# Patient Record
Sex: Male | Born: 1935
Health system: Southern US, Community
[De-identification: ages and names within clinical notes are randomized; demographics above are authoritative.]

## PROBLEM LIST (undated history)

## (undated) DIAGNOSIS — I872 Venous insufficiency (chronic) (peripheral): Secondary | ICD-10-CM

## (undated) DIAGNOSIS — L719 Rosacea, unspecified: Secondary | ICD-10-CM

## (undated) DIAGNOSIS — I442 Atrioventricular block, complete: Secondary | ICD-10-CM

## (undated) DIAGNOSIS — Z8489 Family history of other specified conditions: Secondary | ICD-10-CM

## (undated) DIAGNOSIS — I480 Paroxysmal atrial fibrillation: Secondary | ICD-10-CM

## (undated) DIAGNOSIS — I83893 Varicose veins of bilateral lower extremities with other complications: Secondary | ICD-10-CM

## (undated) DIAGNOSIS — H3530 Unspecified macular degeneration: Secondary | ICD-10-CM

## (undated) DIAGNOSIS — D696 Thrombocytopenia, unspecified: Secondary | ICD-10-CM

## (undated) HISTORY — PX: TONSILLECTOMY: SUR1361

## (undated) HISTORY — PX: KNEE ARTHROSCOPY: SUR90

## (undated) HISTORY — DX: Thrombocytopenia, unspecified: D69.6

## (undated) HISTORY — DX: Rosacea, unspecified: L71.9

## (undated) HISTORY — DX: Paroxysmal atrial fibrillation: I48.0

## (undated) HISTORY — DX: Gilbert syndrome: E80.4

## (undated) HISTORY — DX: Atrioventricular block, complete: I44.2

## (undated) HISTORY — DX: Unspecified macular degeneration: H35.30

## (undated) HISTORY — PX: VASECTOMY: SHX75

## (undated) HISTORY — DX: Venous insufficiency (chronic) (peripheral): I87.2

## (undated) HISTORY — DX: Varicose veins of bilateral lower extremities with other complications: I83.893

---

## 2002-03-16 ENCOUNTER — Ambulatory Visit (HOSPITAL_COMMUNITY): Admission: RE | Admit: 2002-03-16 | Discharge: 2002-03-16 | Payer: Self-pay | Admitting: Gastroenterology

## 2009-07-18 ENCOUNTER — Ambulatory Visit: Payer: Self-pay | Admitting: Internal Medicine

## 2009-07-24 LAB — CBC WITH DIFFERENTIAL/PLATELET
BASO%: 0.4 % (ref 0.0–2.0)
EOS%: 2.7 % (ref 0.0–7.0)
MCH: 31 pg (ref 27.2–33.4)
MCHC: 33.3 g/dL (ref 32.0–36.0)
MONO#: 0.7 10*3/uL (ref 0.1–0.9)
RBC: 4.72 10*6/uL (ref 4.20–5.82)
RDW: 13 % (ref 11.0–14.6)
WBC: 7.9 10*3/uL (ref 4.0–10.3)
lymph#: 1.7 10*3/uL (ref 0.9–3.3)

## 2009-07-24 LAB — COMPREHENSIVE METABOLIC PANEL
ALT: 16 U/L (ref 0–53)
AST: 24 U/L (ref 0–37)
Albumin: 4.3 g/dL (ref 3.5–5.2)
Calcium: 9.4 mg/dL (ref 8.4–10.5)
Chloride: 104 mEq/L (ref 96–112)
Creatinine, Ser: 1.15 mg/dL (ref 0.40–1.50)
Potassium: 4.6 mEq/L (ref 3.5–5.3)
Sodium: 141 mEq/L (ref 135–145)
Total Protein: 6.6 g/dL (ref 6.0–8.3)

## 2010-06-06 NOTE — Op Note (Signed)
   NAME:  HOVANES, HYMAS NO.:  [id number]   MEDICAL RECORD NO.:  [id number]                   PATIENT TYPE:  AMB   LOCATION:  ENDO                                 FACILITY:  [facility]   PHYSICIAN:  Danise Edge, M.D.                DATE OF BIRTH:  [birth year]   DATE OF PROCEDURE:  03/16/2002  DATE OF DISCHARGE:                                 OPERATIVE REPORT   PROCEDURE:  Colonoscopy.   INDICATIONS:  The patient is a 77 year old male born 22-Apr-2031.  The patient  is scheduled to undergo his first screening colonoscopy with polypectomy to  prevent colon cancer.   ENDOSCOPIST:  Danise Edge, M.D.   PREMEDICATION:  Versed 5 mg, Demerol 50 mg.   ENDOSCOPE:  Olympus adult colonoscope.   DESCRIPTION OF PROCEDURE:  After obtaining informed consent, the patient was  placed in the left lateral decubitus position.  I administered intravenous  Demerol and intravenous Versed to achieve conscious sedation for the  procedure.  The patient's blood pressure, oxygen saturation, and cardiac  rhythm were monitored throughout the procedure and documented in the medical  record.   Anal inspection was normal.  Digital rectal exam was normal.  The prostate  was non-nodular.  The Olympus adult colonoscope was introduced into the  rectum and advanced to the cecum.  Colonic preparation for the exam today  was excellent.   Rectum normal.   Sigmoid colon and descending colon normal.   Splenic flexure normal.   Transverse colon normal.   Hepatic flexure normal.   Ascending colon normal.   Cecum and ileocecal valve normal.    ASSESSMENT:  Normal screening proctocolonoscopy to the cecum.  No endoscopic  evidence for the presence of colorectal neoplasia.                                               Danise Edge, M.D.    MJ/MEDQ  D:  03/16/2002  T:  03/16/2002  Job:  [id number]   cc:   Ike Bene, M.D.  [address], [address]  [place]  [place]  [address]  Fax: [phone]   Sheppard Plumber. Earlene Plater, M.D.  [address] [place]  [place] [address]  Fax: [phone]

## 2011-02-03 ENCOUNTER — Encounter: Payer: Self-pay | Admitting: Vascular Surgery

## 2011-02-05 ENCOUNTER — Encounter: Payer: Self-pay | Admitting: Vascular Surgery

## 2011-02-06 ENCOUNTER — Ambulatory Visit (INDEPENDENT_AMBULATORY_CARE_PROVIDER_SITE_OTHER): Payer: Medicare Other | Admitting: *Deleted

## 2011-02-06 ENCOUNTER — Ambulatory Visit (INDEPENDENT_AMBULATORY_CARE_PROVIDER_SITE_OTHER): Payer: Medicare Other | Admitting: Vascular Surgery

## 2011-02-06 ENCOUNTER — Encounter: Payer: Self-pay | Admitting: Vascular Surgery

## 2011-02-06 VITALS — BP 126/84 | HR 76 | Resp 12 | Ht 70.0 in | Wt 190.0 lb

## 2011-02-06 DIAGNOSIS — I83893 Varicose veins of bilateral lower extremities with other complications: Secondary | ICD-10-CM

## 2011-02-06 DIAGNOSIS — I872 Venous insufficiency (chronic) (peripheral): Secondary | ICD-10-CM

## 2011-02-06 DIAGNOSIS — M79609 Pain in unspecified limb: Secondary | ICD-10-CM

## 2011-02-06 HISTORY — DX: Venous insufficiency (chronic) (peripheral): I87.2

## 2011-02-06 HISTORY — DX: Varicose veins of bilateral lower extremities with other complications: I83.893

## 2011-02-06 NOTE — Progress Notes (Signed)
[facility] [place]  Referred by:  Hal Stormy Fabian, MD [address] [place], [place] [address]  Reason for referral: Painful left leg varicosities  History of Present Illness  Matthew [name] is a 78 y.o. male who presents with chief complaint: swollen leg.  Patient notes approximately 10 years history of varicose veins in both leg.  He has progression of his sx with increased swelling as the day continues, burning in L calf, and limited aching in both calves.  He has been using calf high compression stocking.  The patient has had no history of DVT, known history of varicose vein, no history of venous stasis ulcers, no history of  Lymphedema and no history of skin changes in lower legs.  There is a maternal history of varicose veins.  Pt is an avid runner so he would mgmt of his sx.  Past Medical History  Diagnosis Date  . Rosacea   . Macular degeneration   . Gilbert's syndrome   . Thrombocytopenia     Mild per Dr. Arbutus Ped  . Bradycardia   . Venous insufficiency     Past Surgical History  Procedure Date  . Knee arthroscopy     right knee  . Tonsillectomy   . Vasectomy     History   Social History  . Marital Status: Single    Spouse Name: N/A    Number of Children: N/A  . Years of Education: N/A   Occupational History  . Not on file.   Social History Main Topics  . Smoking status: Never Smoker   . Smokeless tobacco: Never Used  . Alcohol Use: Yes     rare  . Drug Use: Not on file  . Sexually Active: Not on file   Other Topics Concern  . Not on file   Social History Narrative  . No narrative on file    Family History  Problem Relation Age of Onset  . Cancer Brother     acute lymphocytic leukemia  . Hypertension Brother   . Heart disease Brother   . Diabetes Brother   . Cancer Mother     Current Outpatient Prescriptions on File Prior to Visit  Medication Sig Dispense Refill  . fluticasone (FLONASE) 50 MCG/ACT nasal  spray Place 2 sprays into the nose daily.      . Glucosamine 500 MG TABS Take by mouth every Monday, Wednesday, and Friday.      . Lutein 6 MG TABS Take by mouth.      . Omega-3 Fatty Acids (FISH OIL) 1000 MG CAPS Take 1,000 mg by mouth every Monday, Wednesday, and Friday.      . sildenafil (VIAGRA) 100 MG tablet Take 100 mg by mouth daily as needed.        Allergies  Allergen Reactions  . Codeine     "makes him crazy"     Review of Systems (Positive items checked otherwise negative)  General: [ ]  Weight loss, [ ]  Weight gain, [ ]   Loss of appetite, [ ]  Fever  Neurologic: [ ]  Dizziness, [ ]  Blackouts, [ ]  Headaches, [ ]  Seizure  Ear/Nose/Throat: [ ]  Change in eyesight, [ ]  Change in hearing, [ ]  Nose bleeds, [ ]  Sore throat  Vascular: [ ]  Pain in legs with walking, [ ]  Pain in feet while lying flat, [ ]  Non-healing ulcer, Stroke, [ ]  "Mini stroke", [ ]  Slurred speech, [ ]  Temporary blindness, [ ]  Blood clot  in vein, [ ]  Phlebitis, [x]  varicose veins  Pulmonary: [ ]  Home oxygen, [ ]  Productive cough, [ ]  Bronchitis, [ ]  Coughing up blood, [ ]  Asthma, [ ]  Wheezing  Musculoskeletal: [ ]  Arthritis, [ ]  Joint pain, [ ]  Muscle pain  Cardiac: [ ]  Chest pain, [ ]  Chest tightness/pressure, [ ]  Shortness of breath when lying flat, [ ]  Shortness of breath with exertion, [ ]  Palpitations, [ ]  Heart murmur, [ ]  Arrythmia,  [ ]  Atrial fibrillation  Hematologic: [ ]  Bleeding problems, [ ]  Clotting disorder, [ ]  Anemia  Psychiatric:  [ ]  Depression, [ ]  Anxiety, [ ]  Attention deficit disorder  Gastrointestinal:  [ ]  Black stool,[ ]   Blood in stool, [ ]  Peptic ulcer disease, [ ]  Reflux, [ ]  Hiatal hernia, [ ]  Trouble swallowing, [ ]  Diarrhea, [ ]  Constipation  Urinary:  [ ]  Kidney disease, [ ]  Burning with urination, [ ]  Frequent urination, [ ]  Difficulty urinating  Skin: [ ]  Ulcers, [ ]  Rashes   Physical Examination  Filed Vitals:   02/06/11 1025  BP: 126/84  Pulse: 76  Resp: 12    Height: 5\' 10"  (1.778 m)  Weight: 190 lb (86.183 kg)   Body mass index is 27.26 kg/(m^2).  General: A&O x 3, WDWN  Head: [place]/AT  Ear/Nose/Throat: Hearing grossly intact, nares w/o erythema or drainage, oropharynx w/o Erythema/Exudate  Eyes: PERRLA, EOMI  Neck: Supple, no nuchal rigidity, no palpable LAD  Pulmonary: Sym exp, good air movt, CTAB, no rales, rhonchi, & wheezing  Cardiac: RRR, Nl S1, S2, no Murmurs, rubs or gallops  Vascular: Vessel Right Left  Radial Palpable Palpable  Brachial Palpable Palpable  Carotid Palpable, without bruit Palpable, without bruit  Aorta Non-palpable N/A  Femoral Palpable Palpable  Popliteal Non-palpable Non-palpable  PT Palpable Palpable  DP Palpable Palpable   Gastrointestinal: soft, NTND, -G/R, - HSM, - masses, - CVAT B  Musculoskeletal: M/S 5/5 throughout , Extremities without ischemic changes , extensive varicose veins and spider veins in both leg, lipodermatosclerosis in both legs, minimal swelling in both legs  Neurologic: CN 2-12 intact , Pain and light touch intact in extremities , Motor exam as listed above  Psychiatric: Judgment intact, Mood & affect appropriate for pt's clinical situation  Dermatologic: See M/S exam for extremity exam, no rashes otherwise noted  Lymph : No Cervical, Axillary, or Inguinal lymphadenopathy   Non-Invasive Vascular Imaging  BLE Venous Insufficiency Duplex (Date: 02/06/11):   RLE: not examined  LLE: L GSV reflux, normal deep system, no DVT  Outside Studies/Documentation 5 pages of outside documents were reviewed including: outpatient clinic workup of his CVI.  Medical Decision Making  TIMOTEO CARREIRO is a 78 y.o. male who presents with: chronic venous insufficiency (C4).   Based on the patient's history and examination, I recommend: thigh high compression stocking 20-30 mm Hg for 3 months to meet insurance requirement prior to left GSV laser ablation.  Follow up in 3 months for evaluation  in vein clinic for EVLA of L GSV.  I suspect the R leg will also have significant reflux, so that study will need to be done prior to intervention on that leg.  Thank you for allowing [place] to participate in this patient's care.  Leonides Sake, MD [facility] Office: [phone] Pager: [phone]  02/06/2011, 10:35 AM

## 2011-02-12 NOTE — Procedures (Unsigned)
LOWER EXTREMITY VENOUS REFLUX EXAM  INDICATION:  Complaint of left lower extremity pain and discoloration.  EXAM:  Using color-flow imaging and pulse Doppler spectral analysis, the left common femoral, superficial femoral, popliteal, posterior tibial, greater and lesser saphenous veins are evaluated.  There is no evidence suggesting deep venous insufficiency in the left lower extremity.  The left saphenofemoral junction and nontortuous great saphenous vein demonstrate reflux of >58milliseconds with calibers as described below.   The left proximal short saphenous vein demonstrates competency.  GSV Diameter (used if found to be incompetent only)                                           Right    Left Proximal Greater Saphenous Vein           cm       0.64 cm Proximal-to-mid-thigh                     cm       cm Mid thigh                                 cm       0.66 cm Mid-distal thigh                          cm       cm Distal thigh                              cm       0.54 cm Knee                                      cm       0.55 cm  IMPRESSION: 1. Left great saphenous vein reflux is noted, as described above. 2. The left deep venous system and small saphenous vein demonstrate     competency.   ___________________________________________ Fransisco Hertz, MD  CH/MEDQ  D:  02/09/2011  T:  02/09/2011  Job:  [id number]

## 2011-05-04 ENCOUNTER — Ambulatory Visit: Payer: Medicare Other | Admitting: Vascular Surgery

## 2011-05-08 ENCOUNTER — Encounter: Payer: Self-pay | Admitting: Vascular Surgery

## 2011-05-11 ENCOUNTER — Encounter: Payer: Self-pay | Admitting: Vascular Surgery

## 2011-05-11 ENCOUNTER — Ambulatory Visit (INDEPENDENT_AMBULATORY_CARE_PROVIDER_SITE_OTHER): Payer: Medicare Other | Admitting: Vascular Surgery

## 2011-05-11 VITALS — BP 129/82 | HR 60 | Resp 20 | Ht 71.0 in | Wt 182.0 lb

## 2011-05-11 DIAGNOSIS — J17 Pneumonia in diseases classified elsewhere: Secondary | ICD-10-CM | POA: Insufficient documentation

## 2011-05-11 DIAGNOSIS — B999 Unspecified infectious disease: Secondary | ICD-10-CM | POA: Insufficient documentation

## 2011-05-11 DIAGNOSIS — I83893 Varicose veins of bilateral lower extremities with other complications: Secondary | ICD-10-CM

## 2011-05-11 NOTE — Progress Notes (Signed)
Subjective:     Patient ID: Matthew [name], male   DOB: 17-Jun-1931, 78 y.o.   MRN: [id number]  HPI this 78 year old male returns for further evaluation regarding venous insufficiency both legs left worse than right. He has painful varicosities in the left leg which have been causing aching burning and stinging discomfort. This has persisted despite long light elastic compression stockings 20-30 mm gradient and elevation and ibuprofen for the past 3 months. He has similar symptoms in the right leg which are not quite as severe. These are also resistant to conservative measures. He has no history of DVT.  Past Medical History  Diagnosis Date  . Rosacea   . Macular degeneration   . Gilbert's syndrome   . Thrombocytopenia     Mild per Dr. Arbutus Ped  . Bradycardia   . Venous insufficiency     History  Substance Use Topics  . Smoking status: Never Smoker   . Smokeless tobacco: Never Used  . Alcohol Use: Yes     rare    Family History  Problem Relation Age of Onset  . Cancer Brother     acute lymphocytic leukemia  . Hypertension Brother   . Heart disease Brother   . Diabetes Brother   . Cancer Mother     Allergies  Allergen Reactions  . Codeine     "makes him crazy"    Current outpatient prescriptions:dabigatran (PRADAXA) 150 MG CAPS, Take 150 mg by mouth every 12 (twelve) hours., Disp: , Rfl: ;  fluticasone (FLONASE) 50 MCG/ACT nasal spray, Place 2 sprays into the nose daily., Disp: , Rfl: ;  Glucosamine 500 MG TABS, Take by mouth every Monday, Wednesday, and Friday., Disp: , Rfl: ;  Lutein 6 MG TABS, Take by mouth., Disp: , Rfl:  Omega-3 Fatty Acids (FISH OIL) 1000 MG CAPS, Take 1,000 mg by mouth every Monday, Wednesday, and Friday., Disp: , Rfl: ;  sildenafil (VIAGRA) 100 MG tablet, Take 100 mg by mouth daily as needed., Disp: , Rfl:   BP 129/82  Pulse 60  Resp 20  Ht 5\' 11"  (1.803 m)  Wt 182 lb (82.555 kg)  BMI 25.38 kg/m2  Body mass index is 25.38  kg/(m^2).         Review of Systems     Objective:   Physical Exam blood pressure 102 heart rate 60 respirations 20 General well-developed well-nourished male in no apparent distress alert and oriented x3 Lower extremity exam left leg 3+ femoral popliteal dorsalis pedis pulse is palpable. There is hyperpigmentation in the lower third of the left leg. He has extensive patterns of reticular and spider veins in the medial thigh and medial calf with some bulging varicosities over the great saphenous system. Right leg has similar reticular and spider findings but no bulging varicosities. He does have hyperpigmentation on the right as well.    Assessment:     Venous duplex exam last visit confirmed gross reflux left greater saphenous vine Right leg has not been studied formally This patient has symptomatic venous insufficiency left leg which has not responded to conservative measures and is affecting his daily living    Plan:     The patient needs a laser ablation left great saphenous vein to be followed by a return appointment in 3 months for further examination. We'll obtain formal venous duplex exam of right lower extremity following procedure on left leg

## 2011-05-18 ENCOUNTER — Other Ambulatory Visit: Payer: Self-pay | Admitting: *Deleted

## 2011-05-18 DIAGNOSIS — I83893 Varicose veins of bilateral lower extremities with other complications: Secondary | ICD-10-CM

## 2011-05-26 ENCOUNTER — Other Ambulatory Visit: Payer: Self-pay | Admitting: *Deleted

## 2011-05-26 DIAGNOSIS — I83893 Varicose veins of bilateral lower extremities with other complications: Secondary | ICD-10-CM

## 2011-06-19 ENCOUNTER — Encounter: Payer: Self-pay | Admitting: Vascular Surgery

## 2011-06-22 ENCOUNTER — Encounter: Payer: Self-pay | Admitting: Vascular Surgery

## 2011-06-22 ENCOUNTER — Ambulatory Visit (INDEPENDENT_AMBULATORY_CARE_PROVIDER_SITE_OTHER): Payer: Medicare Other | Admitting: Vascular Surgery

## 2011-06-22 VITALS — BP 158/68 | HR 53 | Resp 18 | Ht 71.0 in | Wt 182.0 lb

## 2011-06-22 DIAGNOSIS — I83893 Varicose veins of bilateral lower extremities with other complications: Secondary | ICD-10-CM

## 2011-06-22 HISTORY — PX: ENDOVENOUS ABLATION SAPHENOUS VEIN W/ LASER: SUR449

## 2011-06-22 NOTE — Progress Notes (Signed)
Laser Ablation Procedure      Date: 06/22/2011    Matthew [name] DOB:11/22/1931  Consent signed: Yes  Surgeon:J.D. Hart Rochester  Procedure: Laser Ablation: left Greater Saphenous Vein  BP 158/68  Pulse 53  Resp 18  Ht 5\' 11"  (1.803 m)  Wt 182 lb (82.555 kg)  BMI 25.38 kg/m2  Start time: 1:00   End time: 1:50  Tumescent Anesthesia: 250 cc 0.9% NaCl with 50 cc Lidocaine HCL with 1% Epi and 15 cc 8.4% NaHCO3  Local Anesthesia: 6 cc Lidocaine HCL and NaHCO3 (ratio 2:1)  Pulsed mode: Watts 15 Seconds 1 Pulses:1 Total Pulses:139 Total Energy: 2085 Total Time: 2:19    Patient tolerated procedure well: Yes  Notes:   Description of Procedure:  After marking the course of the saphenous vein and the secondary varicosities in the standing position, the patient was placed on the operating table in the supine position, and the left leg was prepped and draped in sterile fashion. Local anesthetic was administered, and under ultrasound guidance the saphenous vein was accessed with a micro needle and guide wire; then the micro puncture sheath was placed. A guide wire was inserted to the saphenofemoral junction, followed by a 5 french sheath.  The position of the sheath and then the laser fiber below the junction was confirmed using the ultrasound and visualization of the aiming beam.  Tumescent anesthesia was administered along the course of the saphenous vein using ultrasound guidance. Protective laser glasses were placed on the patient, and the laser was fired at 15 watt pulsed mode advancing 1-2 mm per sec.  For a total of 2085 joules.  A steri strip was applied to the puncture site.    ABD pads and thigh high compression stockings were applied.  Ace wrap bandages were applied over the phlebectomy sites and at the top of the saphenofemoral junction.  Blood loss was less than 15 cc.  The patient ambulated out of the operating room having tolerated the procedure well.

## 2011-06-22 NOTE — Progress Notes (Signed)
Subjective:     Patient ID: Matthew [name], male   DOB: 1931-12-23, 78 y.o.   MRN: [id number]  HPI this 78 year old male patient had laser ablation of the left great saphenous vein from the knee to the saphenofemoral junction performed under local tumescent anesthesia today. A total of 2085 J of energy was utilized. He tolerated the procedure well.   Review of Systems     Objective:   Physical ExamBP 158/68  Pulse 53  Resp 18  Ht 5\' 11"  (1.803 m)  Wt 182 lb (82.555 kg)  BMI 25.38 kg/m2      Assessment:    well-tolerated laser ablation left great saphenous vein performed for venous hypertension with severe skin changes distally    Plan:     Return June 10 for venous duplex exam to confirm closure left great saphenous vein and reflux study of right great saphenous system

## 2011-06-23 ENCOUNTER — Telehealth: Payer: Self-pay | Admitting: *Deleted

## 2011-06-23 ENCOUNTER — Encounter: Payer: Self-pay | Admitting: Vascular Surgery

## 2011-06-23 NOTE — Telephone Encounter (Signed)
Pt doing well. No problems or pain. Following all instructions. Reminded him of his fu appt next mon.

## 2011-06-26 ENCOUNTER — Encounter: Payer: Self-pay | Admitting: Vascular Surgery

## 2011-06-29 ENCOUNTER — Ambulatory Visit (INDEPENDENT_AMBULATORY_CARE_PROVIDER_SITE_OTHER): Payer: Medicare Other | Admitting: Vascular Surgery

## 2011-06-29 ENCOUNTER — Encounter: Payer: Self-pay | Admitting: Vascular Surgery

## 2011-06-29 ENCOUNTER — Encounter (INDEPENDENT_AMBULATORY_CARE_PROVIDER_SITE_OTHER): Payer: Medicare Other | Admitting: *Deleted

## 2011-06-29 VITALS — BP 125/77 | HR 56 | Temp 98.2°F | Ht 71.0 in | Wt 185.0 lb

## 2011-06-29 DIAGNOSIS — I83893 Varicose veins of bilateral lower extremities with other complications: Secondary | ICD-10-CM

## 2011-06-29 DIAGNOSIS — I8390 Asymptomatic varicose veins of unspecified lower extremity: Secondary | ICD-10-CM | POA: Insufficient documentation

## 2011-06-29 DIAGNOSIS — Z48812 Encounter for surgical aftercare following surgery on the circulatory system: Secondary | ICD-10-CM

## 2011-06-29 NOTE — Progress Notes (Signed)
Subjective:     Patient ID: Matthew [name], male   DOB: 15-Sep-1931, 78 y.o.   MRN: [id number]  HPI this 78 year old male returns 1 week post laser ablation left great saphenous vein for venous hypertension with pain consisting of aching throbbing and burning discomfort. He has had no chest pain, swelling in the left ankle, or other new symptoms since his procedure week ago. He has been wearing his long leg elastic stocking as well as taking ibuprofen he has been having also some similar symptoms but not as severe in the right lower extremity.  Past Medical History  Diagnosis Date  . Rosacea   . Macular degeneration   . Gilbert's syndrome   . Thrombocytopenia     Mild per Dr. Arbutus Ped  . Bradycardia   . Venous insufficiency     History  Substance Use Topics  . Smoking status: Never Smoker   . Smokeless tobacco: Never Used  . Alcohol Use: Yes     rare    Family History  Problem Relation Age of Onset  . Cancer Brother     acute lymphocytic leukemia  . Hypertension Brother   . Heart disease Brother   . Diabetes Brother   . Cancer Mother     Allergies  Allergen Reactions  . Codeine     "makes him crazy"    Current outpatient prescriptions:dabigatran (PRADAXA) 150 MG CAPS, Take 150 mg by mouth every 12 (twelve) hours., Disp: , Rfl: ;  fluticasone (FLONASE) 50 MCG/ACT nasal spray, Place 2 sprays into the nose daily., Disp: , Rfl: ;  Glucosamine 500 MG TABS, Take by mouth every Monday, Wednesday, and Friday., Disp: , Rfl: ;  Lutein 6 MG TABS, Take by mouth., Disp: , Rfl:  Omega-3 Fatty Acids (FISH OIL) 1000 MG CAPS, Take 1,000 mg by mouth every Monday, Wednesday, and Friday., Disp: , Rfl: ;  sildenafil (VIAGRA) 100 MG tablet, Take 100 mg by mouth daily as needed., Disp: , Rfl:   BP 125/77  Pulse 56  Temp(Src) 98.2 F (36.8 C) (Oral)  Ht 5\' 11"  (1.803 m)  Wt 185 lb (83.915 kg)  BMI 25.80 kg/m2  Body mass index is 25.80 kg/(m^2).           Review of Systems denies  chest pain, dyspnea on exertion, PND, orthopnea, hemoptysis, claudication.     Objective:   Physical Exam blood pressure 125/77 heart rate 56 respirations 18 General well-developed well-nourished male no apparent stress alert and oriented x3 Lungs no rhonchi or wheezing Left leg with no distal edema. There is mild/moderate tenderness along the course of the great saphenous vein from the knee to the saphenofemoral junction but no ecchymosis. There is 3+ dorsalis pedis pulse palpable. Right leg has scattered spider veins but no bulging varicosities or distal edema.  Today I ordered a venous duplex exam of the right and left legs. There is no DVT in the left leg and the left great saphenous vein is closed from the knee to near the saphenofemoral junction. Right leg has no significant reflux in the superficial system and no DVT    Assessment:     Successful laser ablation left great saphenous vein for pain and swelling due to venous hypertension No significant venous insufficiency right leg on duplex exam    Plan:     Will wear elastic compression stockings for one more week and then on an as necessary basis Return to see [place] on when necessary basis

## 2011-06-30 ENCOUNTER — Ambulatory Visit: Payer: Medicare Other | Admitting: Vascular Surgery

## 2011-07-06 NOTE — Procedures (Unsigned)
DUPLEX DEEP VENOUS EXAM - LOWER EXTREMITY  INDICATION:  One week status post left great saphenous vein ablation; right lower extremity swelling with varicose veins  HISTORY:  Edema:  Yes Trauma/Surgery:  Ablation of left great saphenous vein Pain:  No PE:  No Previous DVT:  No Anticoagulants:  No Other:  DUPLEX EXAM:               CFV   SFV   PopV  PTV    GSV               R  L  R  L  R  L  R   L  R  L Thrombosis    o  o  o     o  o  o      o  + Spontaneous   +  +  +     +  +  +      +  o Phasic        +  +  +     +  +  +      +  o Augmentation  +  +  +     +  +  +      +  o Compressible  +  +  +     +  +  +      +  o Competent     +     +     +     +      +  Legend:  + - yes  o - no  p - partial  D - decreased  IMPRESSION: 1. Successful ablation of left great saphenous vein approximately 2.7     cm distal to the junction through the insertion point without     common femoral vein involvement. 2. No evidence of deep or superficial venous reflux in the right lower     extremity.  No evidence of deep venous thrombosis or superficial     venous thrombus in the right lower extremity. 3. Of note, venous flow is markedly pulsatile.   _____________________________ Quita Skye. Hart [place], M.D.  LT/MEDQ  D:  06/29/2011  T:  06/29/2011  Job:  [id number]

## 2011-08-05 ENCOUNTER — Encounter (HOSPITAL_COMMUNITY): Payer: Self-pay | Admitting: Pharmacy Technician

## 2011-08-06 ENCOUNTER — Other Ambulatory Visit: Payer: Self-pay | Admitting: Cardiology

## 2011-08-06 NOTE — H&P (Signed)
Office Visit     Patient: Matthew [name], Matthew [name] Provider: Crixus Mcaulay, MD  DOB: 01/18/1932 Age: 79 Y Sex: Male Date: 08/05/2011  Phone: [phone]   Address: [address], [place], [place]  Pcp: HAL STONEKING       Subjective:     CC:    1. 4 month follow up.        HPI:  General:  To quote prior note one month ago "79-year-old here for evaluation of atrial fibrillation. Echocardiogram showed normal EF, mild MR/TR and mild RV/LA dilatation. Holter monitor showed atrial fibrillation with slow ventricular response. TSH was 5.7 mildly elevated. His first EKG apparent the looked like junctional rhythm. He was asymptomatic. He may need a pacemaker in the future due to AV node disease. He is on no medication. Heart rate is 50.   According to the new CHAD-VAS score, he is 2 for age >75. Because of this we have long discussion about anticoagulation. I went ahead and prescribed him Pradaxa 150mg BID. I instructed clearly about this medication. He is to take this with meal. Look for any signs of GERD. With his mild thrombocytopenia, look for signs of bleeding. I also described to him that age greater than 75 portends a slightly higher risk of bleeding as well. We discussed that there is no reversal agent for this medication.   He has had no prior stroke, no diabetes. He's not treated for hypertension. 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 He is a pilot."  03/31/10 - Has GERD/ Taking it during meals and this has improved. No bleeding. No CP. Has a bad cold recently. Some spotting of nasal passage.He was asymptomatic in regards to his atrial fibrillation. He did have a bad "flu" approximately 10 days after taking the flu shot. I wonder if this was true during his atrial fibrillation activity. Nonetheless, he did have 24 hours of atrial fibrillation on his heart monitor. Today on auscultation, he sounds regular. I will verify with EKG. We had a lengthy discussion with he and his wife, questions were  answered. If he is in normal rhythm, I explained to him that he does not need a cardioversion. He believes that the atrial fibrillation may be because he was out of his routine, jogging, because of his arthroscopic knee surgery. We have decided not to pursue cardioversion. He is asymptomatic. His heart rate is well controlled. If he were having symptoms or limitations I would certainly be pushing for this. Continue with Pradaxa. Platelet count 102,000. He has seen hematologist in the past. Stable. Avoid aspirin on a continuous basis. On August 05, 2011 he is doing very well. No bleeding. No strokelike symptoms. He does not feel any symptoms from his atrial fibrillation. No chest pain..        ROS:  No chest pain, no syncope, no bleeding, no stroke like symptoms.       Medical History: Rosacea, Macular degeneration, Gilbert's syndrome, hematuria neg eval Dr Grapey, mild thrombocytopenia-Dr. Mohamed, colonoscopy 2004 Dr Johnson, atrial fibrillation 01/2011 on Holter monitor, Tsh 5.7 01/2011 repeat 2 mos, mild MR echo 01/2011, venous insufficiency-Dr. Lawson.        Family History:        Social History:  General:  History of smoking  cigarettes: Never smoked Alcohol: yes, Rare.  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  Marital Status: married, Barbara.  Enjoys aviation built his own   airplane, enjoys running.       Medications: Fish Oil 1000 MG Capsule 1 tablet MWF, Lutein 6 MG Capsule 1 tablet MWF, Glucosamine 500 MG Tablet 1 tablet MWF, Pradaxa 150 MG . capsule 1 capsule Twice a day, Viagra 100MG Not Specified tablet TAKE ONE TABLET BY MOUTH ONCE A DAY AS NEEDED , Medication List reviewed and reconciled with the patient       Allergies: Codeine (for allergy): "crazy".       Objective:     Vitals: Wt 186, Wt change -3 lb, Ht 70, BMI 26.69, Pulse sitting 56, BP sitting 112/76.        Examination:  Cardiology, General:  GENERAL APPEARANCE: pleasant, NAD.  HEART SOUNDS: regular, normal S1, S2, no S3 or S4.  MURMUR: absent.  LUNGS: no rales or wheezes.  EXTREMITIES: no leg edema.        Assessment:     Assessment:  1. Atrial fibrillation - 427.31 (Primary)  2. Anticoagulant monitoring - V58.61  3. Thrombocytopenia - 287.5, Checking CBC today., Platelet count 102, stable.    Plan:     1. Atrial fibrillation  LAB: Basic Metabolic creatinine 1.1    GLUCOSE 78 70-99 - mg/dL    BUN 25 6-26 - mg/dL    CREATININE 1.16 0.60-1.30 - mg/dl    eGFR (NON-AFRICAN AMERICAN) 61 >60 - calc    eGFR (AFRICAN AMERICAN) 74 >60 - calc    SODIUM 139 136-145 - mmol/L    POTASSIUM 4.7 3.5-5.5 - mmol/L    CHLORIDE 104 98-107 - mmol/L    C02 31 22-32 - mg/dL    ANION GAP 8.9 6.0-20.0 - mmol/L    CALCIUM 9.5 8.6-10.3 - mg/dL     Layan Zalenski 08/05/2011 01:37:06 PM > reassuring     LAB: CBC with Diff hemoglobin 14, platelets 112     WBC 6.4 4.0-11.0 - K/ul     RBC 4.52 4.20-5.80 - M/uL     HGB 14.0 13.0-17.0 - g/dL     HCT 42.6 39.0-52.0 - %     MCH 31.0 27.0-33.0 - pg     MPV 9.8 7.5-10.7 - fL     MCV 94.3 80.0-94.0 - fL H    MCHC 32.8 32.0-36.0 - g/dL     RDW 13.4 11.5-15.5 - %     NRBC# 0.00 -     PLT 112 150-400 - K/uL L    NEUT % 61.0 43.3-71.9 - %     NRBC% 0.10 - %     LYMPH% 24.1 16.8-43.5 - %     MONO % 11.1 4.6-12.4 - %     EOS % 2.8 0.0-7.8 - %     BASO % 1.0 0.0-1.0 - %     NEUT # 3.9 1.9-7.2 - K/uL     LYMPH# 1.50 1.10-2.70 - K/uL     MONO # 0.7 0.3-0.8 - K/uL     EOS # 0.2 0.0-0.6 - K/uL     BASO # 0.1 0.0-0.1 - K/uL      Jimia Gentles 08/05/2011 01:36:44 PM > reassuring, on Pradaxa     LAB: PT (Prothrombin Time) (005199) 1.2, normal     Prothrombin Time 12.7 9.1-12.0 - SEC H    INR 1.2 0.8-1.2 -      Gust Eugene 08/05/2011 04:56:46 PM > okay for cardioversion, on Pradaxa     Diagnostic Imaging:EKG slow atrial flutter., Plummer,Wanda 08/05/2011  10:43:29 AM > Offie Waide 08/05/2011 10:52:51 AM > no significant change from   prior EKG., EKG Rhythm strip A flutter, Plummer,Wanda 08/05/2011 10:49:02 AM > Vernette Moise 08/05/2011 11:44:38 AM >  Doing very well but continues to demonstrate atrial flutter. I would like to give him an opportunity at sinus rhythm to improve his overall AV efficiency. We will set him up for cardioversion. He is currently on anticoagulation. He has had bradycardia in the past, heart rates in the 40s. He may very well revert to sinus bradycardia following cardioversion. We discussed the risks/benefits of cardioversion. Questions have been answered.        2. Anticoagulant monitoring  Stroke prevention with Pradaxa. Continue.       3. Thrombocytopenia  Continue to monitor for any signs of bleeding. He has seen hematology in the past. Stable.prior lab work reviewed.        Immunizations:        Labs:        Procedure Codes: 93000 EKG I AND R, 93040 RHYTHM STRIP I AND R, 80048 ECL BMP, 85025 ECL CBC PLATELET DIFF, 36415 BLOOD COLLECTION ROUTINE VENIPUNCTURE       Preventive:         Follow Up: post conversion      Provider: Emonee Winkowski, MD  Patient: Matthew [name], Matthew [name] DOB: 12/16/1931 Date: 08/05/2011    

## 2011-08-07 ENCOUNTER — Ambulatory Visit (HOSPITAL_COMMUNITY)
Admission: RE | Admit: 2011-08-07 | Discharge: 2011-08-07 | Disposition: A | Discharge status: Home / Self Care | Payer: Medicare Other | Source: Ambulatory Visit | Attending: Cardiology | Admitting: Cardiology

## 2011-08-07 ENCOUNTER — Encounter (HOSPITAL_COMMUNITY): Payer: Self-pay | Admitting: Anesthesiology

## 2011-08-07 ENCOUNTER — Encounter (HOSPITAL_COMMUNITY)
Admission: RE | Disposition: A | Discharge status: Home / Self Care | Payer: Self-pay | Source: Ambulatory Visit | Attending: Cardiology

## 2011-08-07 ENCOUNTER — Ambulatory Visit (HOSPITAL_COMMUNITY): Payer: Medicare Other | Admitting: Anesthesiology

## 2011-08-07 DIAGNOSIS — H3530 Unspecified macular degeneration: Secondary | ICD-10-CM | POA: Insufficient documentation

## 2011-08-07 DIAGNOSIS — K219 Gastro-esophageal reflux disease without esophagitis: Secondary | ICD-10-CM | POA: Insufficient documentation

## 2011-08-07 DIAGNOSIS — I4892 Unspecified atrial flutter: Secondary | ICD-10-CM | POA: Insufficient documentation

## 2011-08-07 DIAGNOSIS — I83893 Varicose veins of bilateral lower extremities with other complications: Secondary | ICD-10-CM

## 2011-08-07 HISTORY — PX: CARDIOVERSION: SHX1299

## 2011-08-07 SURGERY — CARDIOVERSION
Anesthesia: General | Wound class: Clean

## 2011-08-07 MED ORDER — SODIUM CHLORIDE 0.9 % IJ SOLN: 3.0000 mL | INTRAMUSCULAR | Status: DC | PRN

## 2011-08-07 MED ORDER — SODIUM CHLORIDE 0.9 % IV SOLN: 250.0000 mL | INTRAVENOUS | Status: DC

## 2011-08-07 MED ORDER — HYDROCORTISONE 1 % EX CREA
1.0000 "application " | TOPICAL_CREAM | Freq: Three times a day (TID) | CUTANEOUS | Status: DC | PRN
  Filled 2011-08-07: qty 28

## 2011-08-07 MED ORDER — LIDOCAINE HCL (CARDIAC) 20 MG/ML IV SOLN
INTRAVENOUS | Status: DC | PRN
  Administered 2011-08-07: 60 mg via INTRAVENOUS

## 2011-08-07 MED ORDER — GLYCOPYRROLATE 0.2 MG/ML IJ SOLN
INTRAMUSCULAR | Status: DC | PRN
  Administered 2011-08-07: 0.2 mg via INTRAVENOUS

## 2011-08-07 MED ORDER — ATROPINE SULFATE 1 MG/ML IJ SOLN
INTRAMUSCULAR | Status: AC
  Filled 2011-08-07: qty 1

## 2011-08-07 MED ORDER — SODIUM CHLORIDE 0.9 % IJ SOLN: 3.0000 mL | Freq: Two times a day (BID) | INTRAMUSCULAR | Status: DC

## 2011-08-07 MED ORDER — SODIUM CHLORIDE 0.9 % IV SOLN
INTRAVENOUS | Status: DC | PRN
  Administered 2011-08-07: 11:00:00 via INTRAVENOUS

## 2011-08-07 MED ORDER — PROPOFOL 10 MG/ML IV BOLUS
INTRAVENOUS | Status: DC | PRN
  Administered 2011-08-07: 70 mg via INTRAVENOUS

## 2011-08-07 NOTE — Anesthesia Preprocedure Evaluation (Addendum)
Anesthesia Evaluation  Patient identified by MRN, date of birth, ID band Patient awake    Reviewed: Allergy & Precautions, H&P , NPO status , Patient's Chart, lab work & pertinent test results  Airway Mallampati: II      Dental  (+) Teeth Intact   Pulmonary  breath sounds clear to auscultation        Cardiovascular     Neuro/Psych    GI/Hepatic   Endo/Other    Renal/GU      Musculoskeletal   Abdominal   Peds  Hematology   Anesthesia Other Findings   Reproductive/Obstetrics                          Anesthesia Physical Anesthesia Plan  ASA: II  Anesthesia Plan: General   Post-op Pain Management:    Induction: Intravenous  Airway Management Planned: Mask  Additional Equipment:   Intra-op Plan:   Post-operative Plan:   Informed Consent: I have reviewed the patients History and Physical, chart, labs and discussed the procedure including the risks, benefits and alternatives for the proposed anesthesia with the patient or authorized representative who has indicated his/her understanding and acceptance.   Dental advisory given  Plan Discussed with: CRNA and Anesthesiologist  Anesthesia Plan Comments:         Anesthesia Quick Evaluation

## 2011-08-07 NOTE — Anesthesia Postprocedure Evaluation (Signed)
  Anesthesia Post-op Note  Patient: Matthew [name]  Procedure(s) Performed: Procedure(s) (LRB): CARDIOVERSION (N/A)  Patient Location: Short Stay  Anesthesia Type: General  Level of Consciousness: awake, alert  and oriented  Airway and Oxygen Therapy: Patient Spontanous Breathing and Patient connected to nasal cannula oxygen  Post-op Pain: none  Post-op Assessment: Post-op Vital signs reviewed, Patient's Cardiovascular Status Stable, Respiratory Function Stable, RESPIRATORY FUNCTION UNSTABLE, Patent Airway and No signs of Nausea or vomiting  Post-op Vital Signs: Reviewed and stable  Complications: No apparent anesthesia complications

## 2011-08-07 NOTE — Interval H&P Note (Signed)
History and Physical Interval Note:  08/07/2011 11:08 AM  Matthew [name]  has presented today for surgery, with the diagnosis of a fib  The various methods of treatment have been discussed with the patient and family. After consideration of risks, benefits and other options for treatment, the patient has consented to  Procedure(s) (LRB): CARDIOVERSION (N/A) as a surgical intervention .  The patient's history has been reviewed, patient examined, no change in status, stable for surgery.  I have reviewed the patient's chart and labs.  Questions were answered to the patient's satisfaction.     Zamara Cozad

## 2011-08-07 NOTE — Addendum Note (Signed)
Addendum  created 08/07/11 1209 by Quentin Ore, CRNA   Modules edited:Anesthesia Responsible Staff

## 2011-08-07 NOTE — CV Procedure (Signed)
Electrical Cardioversion Procedure Note BORIS ENGELMANN [id number] October 29, 1931  Procedure: Electrical Cardioversion Indications:  Atrial Flutter  Time Out: Verified patient identification, verified procedure,medications/allergies/relevent history reviewed, required imaging and test results available.   Procedure Details  The patient was NPO after midnight. Anesthesia was administered at the beside  by Dr. Vernie Murders with 70mg  of propofol.  Cardioversion was performed with synchronized biphasic defibrillation via AP pads with 150 joules.  1 attempt(s) were performed.  The patient converted to normal sinus rhythm. The patient tolerated the procedure well   IMPRESSION:  Successful cardioversion of atrial flutter. Bradycardia post. Junctional escape as low as 30 bpm. 1mg  of Atropine given as well as Robinol prior. As he awoke, heart rate increased to 40-43 junctional. Occasional P wave noted.   Base at home has been in the 40's in the past.  Will closely monitor. If stable, discharge.     SKAINS, MARK 08/07/2011, 11:17 AM

## 2011-08-07 NOTE — H&P (View-Only) (Signed)
Office Visit     Patient: Matthew [name], Matthew [name] Provider: Donato Schultz, MD  DOB: 11/12/1931 Age: 78 Y Sex: Male Date: 08/05/2011  Phone: [phone]   Address: [address], [place], [id number]  Pcp: HAL STONEKING       Subjective:     CC:    1. 4 month follow up.        HPI:  General:  To quote prior note one month ago "78 year old here for evaluation of atrial fibrillation. Echocardiogram showed normal EF, mild MR/TR and mild RV/LA dilatation. Holter monitor showed atrial fibrillation with slow ventricular response. TSH was 5.7 mildly elevated. His first EKG apparent the looked like junctional rhythm. He was asymptomatic. He may need a pacemaker in the future due to AV node disease. He is on no medication. Heart rate is 50.   According to the new CHAD-VAS score, he is 2 for age >62. Because of this we have long discussion about anticoagulation. I went ahead and prescribed him Pradaxa 150mg  BID. I instructed clearly about this medication. He is to take this with meal. Look for any signs of GERD. With his mild thrombocytopenia, look for signs of bleeding. I also described to him that age greater than 46 portends a slightly higher risk of bleeding as well. We discussed that there is no reversal agent for this medication.   He has had no prior stroke, no diabetes. He's not treated for hypertension. 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 He is a pilot."  03/31/10 - Has GERD/ Taking it during meals and this has improved. No bleeding. No CP. Has a bad cold recently. Some spotting of nasal passage.He was asymptomatic in regards to his atrial fibrillation. He did have a bad "flu" approximately 10 days after taking the flu shot. I wonder if this was true during his atrial fibrillation activity. Nonetheless, he did have 24 hours of atrial fibrillation on his heart monitor. Today on auscultation, he sounds regular. I will verify with EKG. We had a lengthy discussion with he and his wife, questions were  answered. If he is in normal rhythm, I explained to him that he does not need a cardioversion. He believes that the atrial fibrillation may be because he was out of his routine, jogging, because of his arthroscopic knee surgery. We have decided not to pursue cardioversion. He is asymptomatic. His heart rate is well controlled. If he were having symptoms or limitations I would certainly be pushing for this. Continue with Pradaxa. Platelet count 102,000. He has seen hematologist in the past. Stable. Avoid aspirin on a continuous basis. On August 05, 2011 he is doing very well. No bleeding. No strokelike symptoms. He does not feel any symptoms from his atrial fibrillation. No chest pain..        ROS:  No chest pain, no syncope, no bleeding, no stroke like symptoms.       Medical History: Rosacea, Macular degeneration, Gilbert's syndrome, hematuria neg eval Dr Isabel Caprice, mild thrombocytopenia-Dr. Arbutus Ped, colonoscopy 2004 Dr Laural Benes, atrial fibrillation 01/2011 on Holter monitor, Tsh 5.7 01/2011 repeat 2 mos, mild MR echo 01/2011, venous insufficiency-Dr. Hart Rochester.        Family History:        Social History:  General:  History of smoking  cigarettes: Never smoked Alcohol: yes, Rare.  Occupation: unemployed, 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  Marital Status: married, Britta Mccreedy.  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       Medications: Fish Oil 1000 MG Capsule 1 tablet MWF, Lutein 6 MG Capsule 1 tablet MWF, Glucosamine 500 MG Tablet 1 tablet MWF, Pradaxa 150 MG . capsule 1 capsule Twice a day, Viagra 100MG  Not Specified tablet TAKE ONE TABLET BY MOUTH ONCE A DAY AS NEEDED , Medication List reviewed and reconciled with the patient       Allergies: Codeine (for allergy): "crazy".       Objective:     Vitals: Wt 186, Wt change -3 lb, Ht 70, BMI 26.69, Pulse sitting 56, BP sitting 112/76.        Examination:  Cardiology, General:  GENERAL APPEARANCE: pleasant, NAD.  HEART SOUNDS: regular, normal S1, S2, no S3 or S4.  MURMUR: absent.  LUNGS: no rales or wheezes.  EXTREMITIES: no leg edema.        Assessment:     Assessment:  1. Atrial fibrillation - 427.31 (Primary)  2. Anticoagulant monitoring - V58.61  3. Thrombocytopenia - 287.5, Checking CBC today., Platelet count 102, stable.    Plan:     1. Atrial fibrillation  LAB: Basic Metabolic creatinine 1.1    GLUCOSE 78 70-99 - mg/dL    BUN 25 5-40 - mg/dL    CREATININE 9.81 1.91-4.78 - mg/dl    eGFR (NON-AFRICAN AMERICAN) 61 >60 - calc    eGFR (AFRICAN AMERICAN) 74 >60 - calc    SODIUM 139 136-145 - mmol/L    POTASSIUM 4.7 3.5-5.5 - mmol/L    CHLORIDE 104 98-107 - mmol/L    C02 31 22-32 - mg/dL    ANION GAP 8.9 2.9-56.2 - mmol/L    CALCIUM 9.5 8.6-10.3 - mg/dL     [facility] 13/08/6576 01:37:06 PM > reassuring     LAB: CBC with Diff hemoglobin 14, platelets 112     WBC 6.4 4.0-11.0 - K/ul     RBC 4.52 4.20-5.80 - M/uL     HGB 14.0 13.0-17.0 - g/dL     HCT 46.9 62.9-52.8 - %     MCH 31.0 27.0-33.0 - pg     MPV 9.8 7.5-10.7 - fL     MCV 94.3 80.0-94.0 - fL H    MCHC 32.8 32.0-36.0 - g/dL     RDW 41.3 24.4-01.0 - %     NRBC# 0.00 -     PLT 112 150-400 - K/uL L    NEUT % 61.0 43.3-71.9 - %     NRBC% 0.10 - %     LYMPH% 24.1 16.8-43.5 - %     MONO % 11.1 4.6-12.4 - %     EOS % 2.8 0.0-7.8 - %     BASO % 1.0 0.0-1.0 - %     NEUT # 3.9 1.9-7.2 - K/uL     LYMPH# 1.50 1.10-2.70 - K/uL     MONO # 0.7 0.3-0.8 - K/uL     EOS # 0.2 0.0-0.6 - K/uL     BASO # 0.1 0.0-0.1 - K/uL      SKAINS,MARK 08/05/2011 01:36:44 PM > reassuring, on Pradaxa     LAB: PT (Prothrombin Time) (272536) 1.2, normal     Prothrombin Time 12.7 9.1-12.0 - SEC H    INR 1.2 0.8-1.2 -      SKAINS,MARK 08/05/2011 04:56:46 PM > okay for cardioversion, on Pradaxa     Diagnostic Imaging:EKG slow atrial flutter., Plummer,Wanda 08/05/2011  10:43:29 AM > SKAINS,MARK 08/05/2011 10:52:51 AM > no significant change from  prior EKG., EKG Rhythm strip A flutter, Plummer,Wanda 08/05/2011 10:49:02 AM > SKAINS,MARK 08/05/2011 11:44:38 AM >  Doing very well but continues to demonstrate atrial flutter. I would like to give him an opportunity at sinus rhythm to improve his overall AV efficiency. We will set him up for cardioversion. He is currently on anticoagulation. He has had bradycardia in the past, heart rates in the 40s. He may very well revert to sinus bradycardia following cardioversion. We discussed the risks/benefits of cardioversion. Questions have been answered.        2. Anticoagulant monitoring  Stroke prevention with Pradaxa. Continue.       3. Thrombocytopenia  Continue to monitor for any signs of bleeding. He has seen hematology in the past. Stable.prior lab work reviewed.        Immunizations:        Labs:        Procedure Codes: 16109 EKG I AND R, 93040 RHYTHM STRIP I AND R, 80048 ECL BMP, 85025 ECL CBC PLATELET DIFF, 60454 BLOOD COLLECTION ROUTINE VENIPUNCTURE       Preventive:         Follow Up: post conversion      Provider: Donato Schultz, MD  Patient: Matthew [name], Matthew [name] DOB: [birth year] Date: 08/05/2011

## 2011-08-07 NOTE — Transfer of Care (Signed)
Immediate Anesthesia Transfer of Care Note  Patient: Matthew [name]  Procedure(s) Performed: Procedure(s) (LRB): CARDIOVERSION (N/A)  Patient Location: Short Stay  Anesthesia Type: General  Level of Consciousness: awake, alert  and oriented  Airway & Oxygen Therapy: Patient Spontanous Breathing and Patient connected to nasal cannula oxygen  Post-op Assessment: Report given to PACU RN and Post -op Vital signs reviewed and stable  Post vital signs: Reviewed and stable  Complications: No apparent anesthesia complications

## 2011-08-07 NOTE — Preoperative (Signed)
Beta Blockers   Reason not to administer Beta Blockers:Not Applicable 

## 2011-08-07 NOTE — Addendum Note (Signed)
Addendum  created 08/07/11 1209 by Apollos Tenbrink E Lloyd Cullinan, CRNA   Modules edited:Anesthesia Responsible Staff    

## 2011-08-07 NOTE — Addendum Note (Signed)
Addendum  created 08/07/11 1201 by Raiford Simmonds, MD   Modules edited:Anesthesia Attestations

## 2011-08-10 ENCOUNTER — Encounter (HOSPITAL_COMMUNITY): Payer: Self-pay | Admitting: Cardiology

## 2011-12-16 ENCOUNTER — Ambulatory Visit (INDEPENDENT_AMBULATORY_CARE_PROVIDER_SITE_OTHER): Payer: Medicare Other | Admitting: Internal Medicine

## 2011-12-16 ENCOUNTER — Encounter: Payer: Self-pay | Admitting: Internal Medicine

## 2011-12-16 VITALS — BP 128/64 | HR 39 | Ht 71.0 in | Wt 191.0 lb

## 2011-12-16 DIAGNOSIS — I495 Sick sinus syndrome: Secondary | ICD-10-CM

## 2011-12-16 DIAGNOSIS — I498 Other specified cardiac arrhythmias: Secondary | ICD-10-CM

## 2011-12-16 DIAGNOSIS — R001 Bradycardia, unspecified: Secondary | ICD-10-CM

## 2011-12-16 DIAGNOSIS — I4891 Unspecified atrial fibrillation: Secondary | ICD-10-CM

## 2011-12-16 MED ORDER — RIVAROXABAN 20 MG PO TABS: 20.0000 mg | ORAL_TABLET | Freq: Every day | ORAL | Status: DC

## 2011-12-16 NOTE — Patient Instructions (Addendum)
Your physician has recommended that you have a pacemaker inserted. A pacemaker is a small device that is placed under the skin of your chest or abdomen to help control abnormal heart rhythms. This device uses electrical pulses to prompt the heart to beat at a normal rate. Pacemakers are used to treat heart rhythms that are too slow. Wire (leads) are attached to the pacemaker that goes into the chambers of you heart. This is done in the hospital and usually requires and overnight stay. Please see the instruction sheet given to you today for more information.  Will call if decides wants to proceed with Pacemaker implant  [phone]  [facility]   Your physician has recommended you make the following change in your medication:  1) Stop Pradaxa 2) Start Xarelto 20mg  daily---after finishing Pradaxa

## 2011-12-27 ENCOUNTER — Encounter: Payer: Self-pay | Admitting: Internal Medicine

## 2011-12-27 DIAGNOSIS — I495 Sick sinus syndrome: Secondary | ICD-10-CM | POA: Insufficient documentation

## 2011-12-27 DIAGNOSIS — I4891 Unspecified atrial fibrillation: Secondary | ICD-10-CM | POA: Insufficient documentation

## 2011-12-27 NOTE — Assessment & Plan Note (Signed)
Presently appears to be maintaining sinus rhythm. No changes at this time He reports symptoms of occasional GI upset which he attributes to pradaxa.  He requests a "different' anticoagulant at this time. I will therefore switch from pradaxa to xarelto 20mg  daily. No other changes today.

## 2011-12-27 NOTE — Assessment & Plan Note (Signed)
He has resting bradycardia however his heart rate does increase with exercise and he does not appear to have symptoms or limitation at this time.  He is very clear in his desire to avoid PPM.  Presently, he does not have an urgent indication for pacing.  I think that we should therefore follow him conservatively.  If he develops symptoms of bradycardia then we will more readily consider PPM at that time. He will follow closely with Dr Anne Fu and I will see him as needed going forward.

## 2011-12-27 NOTE — Progress Notes (Signed)
Primary Care Physician: Ginette Otto, MD Referring Physician:  Dr Jenetta Downer is a 78 y.o. male with a h/o atrial fibrillation and bradycardia who presents today for EP consultation.  He was initially diagnosed with atrial fibrillation by Holter monitor 1/13.  He was evaluated by Dr Anne Fu and underwent cardioversion of atrial fibrillation 7/13.  He converted to sinus bradycardia with intermittent junctional rhythm post cardioversion.  He reports no symptoms of fatigue, SOB, dizziness, presyncope, or syncope.  He did develop gradually progressing BLE edema.  He feels that his edema is improving.  He has preserved exercise tolerance and continues to do rigorous yard work without limitation.  He walks 2 miles per day without troubles.  He finds that his heart rates increase with exercise appropriately. Today, he denies symptoms of palpitations, chest pain, shortness of breath, orthopnea, PND, dizziness, presyncope, syncope, or neurologic sequela. The patient is tolerating medications without difficulties and is otherwise without complaint today.   Past Medical History  Diagnosis Date  . Rosacea   . Macular degeneration   . Gilbert's syndrome   . Thrombocytopenia     Mild per Dr. Arbutus Ped  . Bradycardia   . Venous insufficiency   . Atrial fibrillation   . Sick sinus syndrome    Past Surgical History  Procedure Date  . Knee arthroscopy     right knee  . Tonsillectomy   . Vasectomy   . Endovascular laser 06-22-11    left EVLT  . Cardioversion 08/07/2011    Procedure: CARDIOVERSION;  Surgeon: Donato Schultz, MD;  Location: [facility] OR;  Service: Cardiovascular;  Laterality: N/A;    Current Outpatient Prescriptions  Medication Sig Dispense Refill  . fluticasone (FLONASE) 50 MCG/ACT nasal spray Place 2 sprays into the nose daily.      . furosemide (LASIX) 20 MG tablet as needed.      . Glucosamine 500 MG TABS Take by mouth every Monday, Wednesday, and Friday.      . Lutein 6 MG  TABS Take by mouth daily.       . Omega-3 Fatty Acids (FISH OIL) 1000 MG CAPS Take 1,000 mg by mouth every Monday, Wednesday, and Friday.      . sildenafil (VIAGRA) 100 MG tablet Take 100 mg by mouth daily as needed. For ED      . Rivaroxaban (XARELTO) 20 MG TABS Take 1 tablet (20 mg total) by mouth daily.  30 tablet  11    Allergies  Allergen Reactions  . Adhesive (Tape) Other (See Comments)    "gets Raw"  . Codeine Other (See Comments)    "makes him crazy"    History   Social History  . Marital Status: Single    Spouse Name: N/A    Number of Children: N/A  . Years of Education: N/A   Occupational History  . Not on file.   Social History Main Topics  . Smoking status: Never Smoker   . Smokeless tobacco: Never Used  . Alcohol Use: Yes     Comment: rare  . Drug Use: No  . Sexually Active: Not on file   Other Topics Concern  . Not on file   Social History Narrative  . No narrative on file    Family History  Problem Relation Age of Onset  . Cancer Brother     acute lymphocytic leukemia  . Hypertension Brother   . Heart disease Brother   . Diabetes Brother   . Cancer  Mother     ROS- All systems are reviewed and negative except as per the HPI above  Physical Exam: Filed Vitals:   12/16/11 1105  BP: 128/64  Pulse: 39  Height: 5\' 11"  (1.803 m)  Weight: 191 lb (86.637 kg)  SpO2: 98%    GEN- The patient is well appearing, alert and oriented x 3 today.   Head- normocephalic, atraumatic Eyes-  Sclera clear, conjunctiva pink Ears- hearing intact Oropharynx- clear Neck- supple, no JVP Lymph- no cervical lymphadenopathy Lungs- Clear to ausculation bilaterally, normal work of breathing Heart- Regular rate and rhythm, no murmurs, rubs or gallops, PMI not laterally displaced GI- soft, NT, ND, + BS Extremities- no clubbing, cyanosis, or edema MS- no significant deformity or atrophy Skin- no rash or lesion Psych- euthymic mood, full affect Neuro- strength and  sensation are intact  EKG today reveals sinus bradycardia 40 bpm, PR 216, LAHB, otherwise normal ekg Echo 08/10/11- EF 60-65%, LA size 46mm, mild to moderate MR GXT 08/10/11 is reviewed which reveals pre-exercise heart rate of 49 bpm with maximum heart rate of 116 bpm achieved at 7:35 minutes of exercise.  He achieved 81% of MHR without limiting symptoms with bradycardia.  Assessment and Plan:

## 2012-02-19 ENCOUNTER — Other Ambulatory Visit: Payer: Self-pay | Admitting: Emergency Medicine

## 2012-02-19 MED ORDER — RIVAROXABAN 20 MG PO TABS: 20.0000 mg | ORAL_TABLET | Freq: Every day | ORAL | Status: DC

## 2012-02-19 NOTE — Telephone Encounter (Signed)
Patient is requesting a 90 day supply

## 2013-02-01 ENCOUNTER — Ambulatory Visit: Payer: Medicare Other | Admitting: Cardiology

## 2013-02-03 ENCOUNTER — Encounter: Payer: Self-pay | Admitting: Cardiology

## 2013-02-03 ENCOUNTER — Ambulatory Visit (INDEPENDENT_AMBULATORY_CARE_PROVIDER_SITE_OTHER): Payer: Medicare Other | Admitting: Cardiology

## 2013-02-03 VITALS — BP 130/83 | HR 58 | Ht 71.0 in | Wt 186.0 lb

## 2013-02-03 DIAGNOSIS — Z7901 Long term (current) use of anticoagulants: Secondary | ICD-10-CM

## 2013-02-03 DIAGNOSIS — I498 Other specified cardiac arrhythmias: Secondary | ICD-10-CM

## 2013-02-03 DIAGNOSIS — R001 Bradycardia, unspecified: Secondary | ICD-10-CM

## 2013-02-03 DIAGNOSIS — I4891 Unspecified atrial fibrillation: Secondary | ICD-10-CM

## 2013-02-03 DIAGNOSIS — I495 Sick sinus syndrome: Secondary | ICD-10-CM

## 2013-02-03 NOTE — Progress Notes (Signed)
[address] [place], [place]  [address] Phone: [phone]) [phone] Fax:  [phone]  Date:  02/03/2013   ID:  Matthew MainsJohn N [name], DOB 18-May-1931, MRN [id number]  PCP:  Ginette OttoSTONEKING,HAL THOMAS, MD   History of Present Illness: Matthew [name] is a 80 y.o. male with asymptomatic bradycardia, sick sinus syndrome, paroxysmal atrial fibrillation on chronic anticoagulation here for followup. Previous consultation with Dr. Johney FrameAllred. No pacemaker at this time. Overall doing well, no syncope, no shortness of breath, no dizziness. He did have one episode with gastrointestinal upset/bloating that resulted in transient dizziness preceding the vomiting episode.  His grandson is short stop for [employer], [place].   Wt Readings from Last 3 Encounters:  02/03/13 186 lb (84.369 kg)  12/16/11 191 lb (86.637 kg)  08/07/11 183 lb (83.008 kg)     Past Medical History  Diagnosis Date  . Rosacea   . Macular degeneration   . Gilbert's syndrome   . Thrombocytopenia     Mild per Dr. Arbutus PedMohamed  . Bradycardia   . Venous insufficiency   . Atrial fibrillation   . Sick sinus syndrome     Past Surgical History  Procedure Laterality Date  . Knee arthroscopy      right knee  . Tonsillectomy    . Vasectomy    . Endovascular laser  06-22-11    left EVLT  . Cardioversion  08/07/2011    Procedure: CARDIOVERSION;  Surgeon: Donato SchultzMark Kaysi Ourada, MD;  [facility];  Service: Cardiovascular;  Laterality: N/A;    Current Outpatient Prescriptions  Medication Sig Dispense Refill  . fluticasone (FLONASE) 50 MCG/ACT nasal spray Place 2 sprays into the nose daily.      . furosemide (LASIX) 20 MG tablet as needed.      . Glucosamine 500 MG TABS Take by mouth every Monday, Wednesday, and Friday.      . Lutein 6 MG TABS Take by mouth daily.       . Rivaroxaban (XARELTO) 20 MG TABS Take 1 tablet (20 mg total) by mouth daily.  90 tablet  3  . sildenafil (VIAGRA) 100 MG tablet Take 100 mg by mouth daily as needed. For ED        No current facility-administered medications for this visit.    Allergies:    Allergies  Allergen Reactions  . Adhesive [Tape] Other (See Comments)    "gets Raw"  . Codeine Other (See Comments)    "makes him crazy"    Social History:  The patient  reports that he has never smoked. He has never used smokeless tobacco. He reports that he drinks alcohol. He reports that he does not use illicit drugs.   ROS:  Please see the history of present illness.  Denies any fevers, chills, orthopnea, PND, syncope, chest pain, shortness of breath  PHYSICAL EXAM: VS:  BP 130/83  Pulse 58  Ht 5\' 11"  (1.803 m)  Wt 186 lb (84.369 kg)  BMI 25.95 kg/m2 Well nourished, well developed, in no acute distress HEENT: normal Neck: no JVD Cardiac:  normal S1, S2; Brady RR; no murmur Lungs:  clear to auscultation bilaterally, no wheezing, rhonchi or rales Abd: soft, nontender, no hepatomegaly Ext: no edema Skin: warm and dry Neuro: no focal abnormalities noted  EKG:  Previous reviewed Labs: 02/02/13-creatinine 1.1, hemoglobin 14.6  ASSESSMENT AND PLAN:  1. Bradycardia-had consultation with Dr. Johney FrameAllred. Discussed pacemaker placement. He is resting bradycardia but this does not seem to  limit him at this time. He desires not to have a pacemaker at this time. 2. Atrial fibrillation-changed to Xarelto currently sinus rhythm. 3. Sick sinus syndrome-continue to monitor clinically. If symptoms worsen, let me know. 4. Chronic anticoagulation-creatinine and hemoglobin normal.   Anne Fu, MD [facility]  02/03/2013 3:10 PM

## 2013-02-03 NOTE — Patient Instructions (Signed)
Your physician recommends that you continue on your current medications as directed. Please refer to the Current Medication list given to you today.  Your physician wants you to follow-up in: 6 months with Dr. Skains. You will receive a reminder letter in the mail two months in advance. If you don't receive a letter, please call our office to schedule the follow-up appointment.  

## 2013-03-14 ENCOUNTER — Other Ambulatory Visit: Payer: Self-pay | Admitting: Internal Medicine

## 2013-03-16 ENCOUNTER — Other Ambulatory Visit: Payer: Self-pay | Admitting: Internal Medicine

## 2013-03-23 ENCOUNTER — Other Ambulatory Visit: Payer: Self-pay

## 2013-03-23 MED ORDER — RIVAROXABAN 20 MG PO TABS: ORAL_TABLET | ORAL | Status: DC

## 2013-04-02 ENCOUNTER — Encounter: Payer: Self-pay | Admitting: *Deleted

## 2013-08-30 ENCOUNTER — Ambulatory Visit: Payer: Medicare Other | Admitting: Cardiology

## 2013-09-15 ENCOUNTER — Ambulatory Visit (INDEPENDENT_AMBULATORY_CARE_PROVIDER_SITE_OTHER): Payer: Medicare Other | Admitting: Cardiology

## 2013-09-15 ENCOUNTER — Encounter: Payer: Self-pay | Admitting: Cardiology

## 2013-09-15 VITALS — BP 112/70 | HR 59 | Ht 71.0 in | Wt 186.0 lb

## 2013-09-15 DIAGNOSIS — I495 Sick sinus syndrome: Secondary | ICD-10-CM

## 2013-09-15 DIAGNOSIS — Z79899 Other long term (current) drug therapy: Secondary | ICD-10-CM

## 2013-09-15 DIAGNOSIS — I4892 Unspecified atrial flutter: Secondary | ICD-10-CM

## 2013-09-15 DIAGNOSIS — I484 Atypical atrial flutter: Secondary | ICD-10-CM | POA: Insufficient documentation

## 2013-09-15 DIAGNOSIS — I498 Other specified cardiac arrhythmias: Secondary | ICD-10-CM

## 2013-09-15 DIAGNOSIS — I4891 Unspecified atrial fibrillation: Secondary | ICD-10-CM | POA: Insufficient documentation

## 2013-09-15 DIAGNOSIS — R001 Bradycardia, unspecified: Secondary | ICD-10-CM | POA: Insufficient documentation

## 2013-09-15 LAB — BASIC METABOLIC PANEL
BUN: 24 mg/dL — ABNORMAL HIGH (ref 6–23)
CALCIUM: 9 mg/dL (ref 8.4–10.5)
CO2: 27 meq/L (ref 19–32)
CREATININE: 0.9 mg/dL (ref 0.4–1.5)
Chloride: 106 mEq/L (ref 96–112)
GFR: 84.16 mL/min (ref >60.00)
Glucose, Bld: 111 mg/dL — ABNORMAL HIGH (ref 70–99)
Potassium: 4.2 mEq/L (ref 3.5–5.1)
Sodium: 139 mEq/L (ref 135–145)

## 2013-09-15 LAB — CBC
HEMATOCRIT: 40 % (ref 39.0–52.0)
HEMOGLOBIN: 13.7 g/dL (ref 13.0–17.0)
MCHC: 34.1 g/dL (ref 30.0–36.0)
MCV: 94.6 fl (ref 78.0–100.0)
Platelets: 108 10*3/uL — ABNORMAL LOW (ref 150.0–400.0)
RBC: 4.23 Mil/uL (ref 4.22–5.81)
RDW: 12.9 % (ref 11.5–15.5)
WBC: 7.9 10*3/uL (ref 4.0–10.5)

## 2013-09-15 NOTE — Patient Instructions (Signed)
The current medical regimen is effective;  continue present plan and medications.  Please have blood work today (CBC,BMP)  Follow up in 6 months with Dr. Skains.  You will receive a letter in the mail 2 months before you are due.  Please call us when you receive this letter to schedule your follow up appointment.  

## 2013-09-15 NOTE — Progress Notes (Signed)
[address] [place], [place]  [address] Phone: [phone] Fax:  [phone]  Date:  09/15/2013   ID:  Matthew [name], DOB 1931/07/30, MRN [id number]  PCP:  Ginette Otto, MD   History of Present Illness: Matthew [name] is a 80 y.o. male with asymptomatic bradycardia, sick sinus syndrome, paroxysmal atrial fibrillation/flutter on chronic anticoagulation here for followup. Previous consultation with Dr. Johney Frame for asymptomatic bradycardia. No pacemaker at this time.   Overall doing well, no syncope, no shortness of breath, no dizziness. He had microscopic hematuria and was checked by urologist. Reassuring.  Interestingly, on 09/15/2013, EKG showed atrial flutter with 4-1 conduction. Asymptomatic. Previously with his atrial fibrillation he felt poorly especially in hot weather.  His grandson is short stop for [employer], [place].   Wt Readings from Last 3 Encounters:  09/15/13 186 lb (84.369 kg)  02/03/13 186 lb (84.369 kg)  12/16/11 191 lb (86.637 kg)     Past Medical History  Diagnosis Date  . Rosacea   . Macular degeneration   . Gilbert's syndrome   . Thrombocytopenia     Mild per Dr. Arbutus Ped  . Bradycardia   . Venous insufficiency   . Atrial fibrillation   . Sick sinus syndrome     Past Surgical History  Procedure Laterality Date  . Knee arthroscopy      right knee  . Tonsillectomy    . Vasectomy    . Endovascular laser  06-22-11    left EVLT  . Cardioversion  08/07/2011    Procedure: CARDIOVERSION;  Surgeon: Donato Schultz, MD;  Location: [facility] OR;  Service: Cardiovascular;  Laterality: N/A;    Current Outpatient Prescriptions  Medication Sig Dispense Refill  . fluticasone (FLONASE) 50 MCG/ACT nasal spray Place 2 sprays into the nose daily.      . Glucosamine 500 MG TABS Take by mouth every Monday, Wednesday, and Friday.      . Lutein 6 MG TABS Take by mouth daily.       . Rivaroxaban (XARELTO) 20 MG TABS tablet TAKE ONE TABLET BY MOUTH ONCE  DAILY  30 tablet  6  . sildenafil (VIAGRA) 100 MG tablet Take 100 mg by mouth daily as needed. For ED       No current facility-administered medications for this visit.    Allergies:    Allergies  Allergen Reactions  . Adhesive [Tape] Other (See Comments)    "gets Raw"  . Codeine Other (See Comments)    "makes him crazy"    Social History:  The patient  reports that he has never smoked. He has never used smokeless tobacco. He reports that he drinks alcohol. He reports that he does not use illicit drugs.   ROS:  Please see the history of present illness.  Denies any fevers, chills, orthopnea, PND, syncope, chest pain, shortness of breath  PHYSICAL EXAM: VS:  BP 112/70  Pulse 59  Ht  (1.803 m)  Wt 186 lb (84.369 kg)  BMI 25.95 kg/m2 Well nourished, well developed, in no acute distress HEENT: normal Neck: no JVD Cardiac:  normal S1, S2; Brady RR; no murmur Lungs:  clear to auscultation bilaterally, no wheezing, rhonchi or rales Abd: soft, nontender, no hepatomegaly Ext: no edema Skin: warm and dry Neuro: no focal abnormalities noted  EKG: 09/15/13-atrial flutter, 4-1 conduction, left axis deviation ECHO 2013 - EF 65%, moderately diltated LA. 46mm Labs: 02/02/13-creatinine 1.1, hemoglobin 14.6  ASSESSMENT AND  PLAN:  1. Bradycardia-had consultation with Dr. Johney Frame. Discussed pacemaker placement. He is resting bradycardia but this does not seem to limit him at this time. He desires not to have a pacemaker at this time. 2. Atrial fibrillation/flutter-Xarelto, currently atrial flutter with 4:1 conduction. Previously sinus rhythm. Since he is asymptomatic, no changes. He will periodically check his blood pressure at home. If he notices increased heart rate, he will let me know. 3. Sick sinus syndrome-continue to monitor clinically. If symptoms worsen, let me know. 4. Chronic anticoagulation-creatinine and hemoglobin normal. Checking CBC and basic metabolic profile today.    Anne Fu, MD [facility]  09/15/2013 1:46 PM

## 2013-10-17 ENCOUNTER — Other Ambulatory Visit: Payer: Self-pay | Admitting: Cardiology

## 2014-02-28 ENCOUNTER — Ambulatory Visit (INDEPENDENT_AMBULATORY_CARE_PROVIDER_SITE_OTHER): Payer: Medicare Other | Admitting: Cardiology

## 2014-02-28 ENCOUNTER — Encounter: Payer: Self-pay | Admitting: Cardiology

## 2014-02-28 VITALS — BP 126/65 | HR 50 | Ht 71.0 in | Wt 188.0 lb

## 2014-02-28 DIAGNOSIS — I872 Venous insufficiency (chronic) (peripheral): Secondary | ICD-10-CM

## 2014-02-28 DIAGNOSIS — I484 Atypical atrial flutter: Secondary | ICD-10-CM

## 2014-02-28 DIAGNOSIS — I480 Paroxysmal atrial fibrillation: Secondary | ICD-10-CM

## 2014-02-28 MED ORDER — RIVAROXABAN 20 MG PO TABS: 20.0000 mg | ORAL_TABLET | Freq: Every day | ORAL | Status: DC

## 2014-02-28 NOTE — Progress Notes (Signed)
[address] [place], [place]  [address] Phone: [phone] Fax:  [phone]  Date:  02/28/2014   ID:  Matthew [name], DOB 1931-03-05, MRN [id number]  PCP:  Ginette Otto, MD   History of Present Illness: Matthew [name] is a 81 y.o. male with asymptomatic bradycardia, sick sinus syndrome, paroxysmal atrial fibrillation/flutter on chronic anticoagulation here for followup. Previous consultation with Dr. Johney Frame for asymptomatic bradycardia. No pacemaker at this time.   Overall doing well, no syncope, no shortness of breath, no dizziness. He had microscopic hematuria and was checked by urologist. Reassuring.  Interestingly, on 09/15/2013, EKG showed atrial flutter with 4-1 conduction. Asymptomatic. Previously with his atrial fibrillation he felt poorly especially in hot weather.  His grandson was short stop for [employer] baseball [place]be Shaggy.  2/10-16-no change in symptoms. Doing well. He will be getting blood work at Dr. Pete Glatter soon.  Wt Readings from Last 3 Encounters:  02/28/14 188 lb (85.276 kg)  09/15/13 186 lb (84.369 kg)  02/03/13 186 lb (84.369 kg)     Past Medical History  Diagnosis Date  . Rosacea   . Macular degeneration   . Gilbert's syndrome   . Thrombocytopenia     Mild per Dr. Arbutus Ped  . Bradycardia   . Venous insufficiency   . Atrial fibrillation   . Sick sinus syndrome     Past Surgical History  Procedure Laterality Date  . Knee arthroscopy      right knee  . Tonsillectomy    . Vasectomy    . Endovascular laser  06-22-11    left EVLT  . Cardioversion  08/07/2011    Procedure: CARDIOVERSION;  Surgeon: Donato Schultz, MD;  Location: [facility] OR;  Service: Cardiovascular;  Laterality: N/A;    Current Outpatient Prescriptions  Medication Sig Dispense Refill  . fluticasone (FLONASE) 50 MCG/ACT nasal spray Place 2 sprays into the nose daily.    . Glucosamine 500 MG TABS Take by mouth every Monday, Wednesday, and Friday.      . Lutein 6 MG TABS Take by mouth daily.     . sildenafil (VIAGRA) 100 MG tablet Take 100 mg by mouth daily as needed. For ED    . XARELTO 20 MG TABS tablet TAKE ONE TABLET BY MOUTH ONCE DAILY 30 tablet 3   No current facility-administered medications for this visit.    Allergies:    Allergies  Allergen Reactions  . Adhesive [Tape] Other (See Comments)    "gets Raw"  . Codeine Other (See Comments)    "makes him crazy"    Social History:  The patient  reports that he has never smoked. He has never used smokeless tobacco. He reports that he drinks alcohol. He reports that he does not use illicit drugs.   ROS:  Please see the history of present illness.  Denies any fevers, chills, orthopnea, PND, syncope, chest pain, shortness of breath  PHYSICAL EXAM: VS:  BP 126/65 mmHg  Pulse 50  Ht  (1.803 m)  Wt 188 lb (85.276 kg)  BMI 26.23 kg/m2  SpO2 99% Well nourished, well developed, in no acute distress HEENT: normal Neck: no JVD Cardiac:  normal S1, S2; Brady RR; no murmur Lungs:  clear to auscultation bilaterally, no wheezing, rhonchi or rales Abd: soft, nontender, no hepatomegaly Ext: no edema Skin: warm and dry Neuro: no focal abnormalities noted  EKG: 09/15/13-atrial flutter, 4-1 conduction, left axis deviation ECHO 2013 -  EF 65%, moderately diltated LA. 46mm Labs: 02/02/13-creatinine 1.1, hemoglobin 14.6  ASSESSMENT AND PLAN:  1. Bradycardia-had consultation with Dr. Johney FrameAllred. Discussed pacemaker placement. He is resting bradycardia but this does not seem to limit him at this time. He desires not to have a pacemaker at this time. 2. Atrial fibrillation/flutter-Xarelto, currently atrial flutter with 4:1 conduction. Previously sinus rhythm. Since he is asymptomatic, no changes. He will periodically check his blood pressure at home. If he notices increased heart rate, he will let me know. Doing well. 3. Sick sinus syndrome-continue to monitor clinically. If symptoms worsen,  let me know. 4. Six-month follow-up 5. Chronic anticoagulation-creatinine and hemoglobin normal. Checking CBC and basic metabolic profile today.   Anne FuSkains, MD [facility]  02/28/2014 2:47 PM

## 2014-02-28 NOTE — Patient Instructions (Signed)
Your physician recommends that you continue on your current medications as directed. Please refer to the Current Medication list given to you today.  Your physician wants you to follow-up in: 6 months with Dr. Skains. You will receive a reminder letter in the mail two months in advance. If you don't receive a letter, please call our office to schedule the follow-up appointment.  

## 2014-07-18 ENCOUNTER — Telehealth: Payer: Self-pay | Admitting: Cardiology

## 2014-07-18 MED ORDER — FUROSEMIDE 40 MG PO TABS: 40.0000 mg | ORAL_TABLET | Freq: Every day | ORAL | Status: DC

## 2014-07-18 NOTE — Telephone Encounter (Signed)
Spoke with pt who is reporting abdominal detention pressing up into the bottom of his rib cage that started on Sunday.  He denies any gas and reports having normal BM's. He has noted a 7 lb weight gain in 6 days (from 183 lbs to 190 lbs).  He reports slight edema in feet and ankles.  Last night he was not able to take his HR with his home BP machine or by palpation.  He has not checked it today.  He tried to do his normal walk/run on the GXT today however after a few minutes he had to stop d/t discomfort in his abdomen around his diaphragm.  He reports he was like this before when he was in [place]. Again, he does not know what his HR is but reports his normal HR is around 55 bpm.  He does not have type of diuretic at home.

## 2014-07-18 NOTE — Telephone Encounter (Signed)
Pt aware of orders.  He is aware to start Furosemide.  He will keep a check on his HR.  It has been in the 50's.  He reports just now when he checked it, it was 39 bpm.  He will continue to keep a check on this.  If it continues to be low he will let us know, otherwise he will be seen on Friday.

## 2014-07-18 NOTE — Telephone Encounter (Signed)
Lasix 40mg  PO QD Have him come in Friday at 2:45pm Donato SchultzSKAINS, Torrie Lafavor, MD

## 2014-07-18 NOTE — Telephone Encounter (Signed)
New Message       Pt calling stating that his stomach is destended and it is hurting underneath his breast bone. Please call back and advise.

## 2014-07-20 ENCOUNTER — Ambulatory Visit (INDEPENDENT_AMBULATORY_CARE_PROVIDER_SITE_OTHER): Payer: Medicare Other | Admitting: Cardiology

## 2014-07-20 ENCOUNTER — Encounter: Payer: Self-pay | Admitting: Cardiology

## 2014-07-20 ENCOUNTER — Encounter: Payer: Self-pay | Admitting: *Deleted

## 2014-07-20 VITALS — BP 140/70 | HR 56 | Ht 71.0 in | Wt 189.6 lb

## 2014-07-20 DIAGNOSIS — I442 Atrioventricular block, complete: Secondary | ICD-10-CM | POA: Insufficient documentation

## 2014-07-20 DIAGNOSIS — R001 Bradycardia, unspecified: Secondary | ICD-10-CM | POA: Diagnosis not present

## 2014-07-20 DIAGNOSIS — I480 Paroxysmal atrial fibrillation: Secondary | ICD-10-CM

## 2014-07-20 DIAGNOSIS — I4892 Unspecified atrial flutter: Secondary | ICD-10-CM | POA: Diagnosis not present

## 2014-07-20 HISTORY — DX: Paroxysmal atrial fibrillation: I48.0

## 2014-07-20 HISTORY — DX: Atrioventricular block, complete: I44.2

## 2014-07-20 LAB — BASIC METABOLIC PANEL
BUN: 34 mg/dL — ABNORMAL HIGH (ref 6–23)
CALCIUM: 8.6 mg/dL (ref 8.4–10.5)
CO2: 28 mEq/L (ref 19–32)
Chloride: 102 mEq/L (ref 96–112)
Creat: 1.22 mg/dL (ref 0.50–1.35)
Glucose, Bld: 84 mg/dL (ref 70–99)
Potassium: 4.1 mEq/L (ref 3.5–5.3)
SODIUM: 142 meq/L (ref 135–145)

## 2014-07-20 LAB — CBC WITH DIFFERENTIAL/PLATELET
Basophils Absolute: 0.1 10*3/uL (ref 0.0–0.1)
Basophils Relative: 1 % (ref 0–1)
Eosinophils Absolute: 0.1 10*3/uL (ref 0.0–0.7)
Eosinophils Relative: 2 % (ref 0–5)
HCT: 36.9 % — ABNORMAL LOW (ref 39.0–52.0)
Hemoglobin: 12.6 g/dL — ABNORMAL LOW (ref 13.0–17.0)
Lymphocytes Relative: 20 % (ref 12–46)
Lymphs Abs: 1.3 10*3/uL (ref 0.7–4.0)
MCH: 31.4 pg (ref 26.0–34.0)
MCHC: 34.1 g/dL (ref 30.0–36.0)
MCV: 92 fL (ref 78.0–100.0)
MPV: 12.7 fL — ABNORMAL HIGH (ref 8.6–12.4)
Monocytes Absolute: 1 10*3/uL (ref 0.1–1.0)
Monocytes Relative: 15 % — ABNORMAL HIGH (ref 3–12)
Neutro Abs: 4.1 10*3/uL (ref 1.7–7.7)
Neutrophils Relative %: 62 % (ref 43–77)
PLATELETS: 97 10*3/uL — AB (ref 150–400)
RBC: 4.01 MIL/uL — ABNORMAL LOW (ref 4.22–5.81)
RDW: 13.4 % (ref 11.5–15.5)
WBC: 6.6 10*3/uL (ref 4.0–10.5)

## 2014-07-20 LAB — TSH: TSH: 5.356 u[IU]/mL — AB (ref 0.350–4.500)

## 2014-07-20 LAB — T4, FREE: Free T4: 0.99 ng/dL (ref 0.80–1.80)

## 2014-07-20 NOTE — Progress Notes (Signed)
[address], [address] [place], [place]  [address] Phone: [phone] Fax:  [phone]  Date:  07/20/2014   ID:  Matthew BARBIAN, DOB Jul 28, 1931, MRN [id number]  PCP:  Ginette Otto, MD   History of Present Illness: Matthew [name] is a 81 y.o. male with, sick sinus syndrome, paroxysmal atrial fibrillation/flutter on chronic anticoagulation here for followup. Previous consultation with Dr. Johney Frame for asymptomatic bradycardia. No pacemaker at this time. However, today he comes into the office with symptoms of shortness of breath, fatigue, increased abdominal girth, bradycardia.  A few days ago, he noted that when he went to go work out on the treadmill, he was having dyspnea on exertion, fatigue. He had to turn the miles per hour down on his treadmill. His heart rate never truly responded. He believes that his heart rate was in the 30s. No syncope. No chest pain.  He is also noted an increased weight gain as high as 190 pounds at home. He believes that this was 4 pounds over his normal weight.  He called our office and we worked him into the appointment slot today. Yesterday we gave him Lasix 40 mg which helped to pull off approximately 4 pounds.  An EKG was performed today in the office which shows complete heart block with marked sinus bradycardia which is not associated with his narrow complex QRS, junctional escape. One beat may truly be conducting. See EKG.  I discussed this with Dr. Graciela Husbands and since he has a narrow complex junctional escape, we are setting him up for pacemaker implantation to be done on Tuesday with Dr. Johney Frame who he has seen in the past. He is not on any AV nodal blocking agents.  Interestingly, on 09/15/2013, EKG showed atrial flutter with 4-1 conduction. Asymptomatic. Previously with his atrial fibrillation he felt poorly especially in hot weather.  His grandson was short stop for [facility] baseball now [place] Bebe Shaggy.   Wt Readings from Last 3  Encounters:  07/20/14 189 lb 9.6 oz (86.002 kg)  02/28/14 188 lb (85.276 kg)  09/15/13 186 lb (84.369 kg)     Past Medical History  Diagnosis Date  . Rosacea   . Macular degeneration   . Gilbert's syndrome   . Thrombocytopenia     Mild per Dr. Arbutus Ped  . Bradycardia   . Venous insufficiency   . Atrial fibrillation   . Sick sinus syndrome     Past Surgical History  Procedure Laterality Date  . Knee arthroscopy      right knee  . Tonsillectomy    . Vasectomy    . Endovascular laser  06-22-11    left EVLT  . Cardioversion  08/07/2011    Procedure: CARDIOVERSION;  Surgeon: Donato Schultz, MD;  Location: [facility];  Service: Cardiovascular;  Laterality: N/A;    Current Outpatient Prescriptions  Medication Sig Dispense Refill  . fluticasone (FLONASE) 50 MCG/ACT nasal spray Place 2 sprays into the nose daily.    . furosemide (LASIX) 40 MG tablet Take 1 tablet (40 mg total) by mouth daily. 30 tablet 3  . Glucosamine 500 MG TABS Take by mouth every Monday, Wednesday, and Friday.    . Lutein 6 MG TABS Take by mouth daily.     . rivaroxaban (XARELTO) 20 MG TABS tablet Take 1 tablet (20 mg total) by mouth daily. 30 tablet 6  . sildenafil (VIAGRA) 100 MG tablet Take 100 mg by mouth daily as needed. For  ED     No current facility-administered medications for this visit.    Allergies:    Allergies  Allergen Reactions  . Adhesive [Tape] Other (See Comments)    "gets Raw"  . Codeine Other (See Comments)    "makes him crazy"    Social History:  The patient  reports that he has never smoked. He has never used smokeless tobacco. He reports that he drinks alcohol. He reports that he does not use illicit drugs.   ROS:  Please see the history of present illness.  Denies any fevers, chills, orthopnea, PND, syncope, chest pain, shortness of breath  PHYSICAL EXAM: VS:  BP 140/70 mmHg  Pulse 56  Ht 5\' 11"  (1.803 m)  Wt 189 lb 9.6 oz (86.002 kg)  BMI 26.46 kg/m2 Well nourished, well  developed, in no acute distress HEENT: normal Neck: no JVD CardiacMildly irregular with ectopy at times, bradycardic, heart rate in the 40s no murmur Lungs:  clear to auscultation bilaterally, no wheezing, rhonchi or rales Abd: soft, nontender, no hepatomegaly Ext: no edema Skin: warm and dry Neuro: no focal abnormalities noted  EKG: today, 07/20/14-heart rate 44 bpm, sinus bradycardia severe with junctional escape, complete heart block, narrow complex QRS. Previously 09/15/13-atrial flutter, 4-1 conduction, left axis deviation  ECHO 2013 - EF 65%, moderately diltated LA. 46mm  Labs: 02/02/13-creatinine 1.1, hemoglobin 14.6  ASSESSMENT AND PLAN:  1. Complete heart block-junctional escape, severe underlying sinus bradycardia. Since he is no longer in atrial flutter with 4-1 conduction, his heart rate is quite slow and at times at home was in the 30s. Currently 44 and symptomatic. Discussed with Dr. Graciela HusbandsKlein. We have set him up for pacemaker implantation with Dr. Johney FrameAllred who he has seen in the past on Tuesday at 3:30 PM. Since he has a narrow complex escape, with the counsel of Dr. Graciela HusbandsKlein, felt comfortable sending him home. Of course if he has any worrisome symptoms he knows to report to the emergency room immediately or call 911. His wife was present for discussion. It is likely that he has been in this escape rhythm for the last few days.  Previously had consultation with Dr. Johney FrameAllred. Discussed pacemaker placement. At that time had resting bradycardia but this did not seem to limit him at this time. He desired not to have a pacemaker at that time. we are checking preoperative lab work, I'm also going to check a TSH and a free T4. I have also asked him to hold his Xarelto for 2 days prior to pacemaker implantation.  2. Atrial fibrillation/flutter-Xarelto, previously in atrial flutter with 4:1 conduction.  3. Sick sinus syndrome-now requires pacemaker. 4. Chronic anticoagulation-creatinine and hemoglobin  normal.   Donato SchultzMark Harleen Fineberg, MD [facility]  07/20/2014 2:50 PM

## 2014-07-20 NOTE — Patient Instructions (Addendum)
Medication Instructions:  Your physician recommends that you continue on your current medications as directed. Please refer to the Current Medication list given to you today.  Labwork: 1. TODAY BMET, CBC W/DIFF, TSH, FREE T4  Testing/Procedures: NONE  Follow-Up: YOU WILL BE SCHEDULED FOR A PACEMAKER IMPLANT 07/24/14 @ 3:30  Any Other Special Instructions Will Be Listed Below (If Applicable).  Pacemaker Implantation The heart has its own electrical system, or natural pacemaker, to regulate the heartbeat. Sometimes, the natural pacemaker system of the heart fails and causes the heart to beat too slowly. If this happens, a pacemaker can be surgically placed to help the heart beat at a normal or programmed rate. A pacemaker is a small, battery-powered device that is placed under the skin and is programmed to sense your heartbeats. If your heart rate is lower than the programmed rate, the pacemaker will pace your heart. Parts of a pacemaker include:  Wires or leads. The leads are placed in the heart and transmit electricity to the heart. The leads are connected to the pulse generator.  Pulse generator. The pulse generator contains a computer and a memory system. The pulse generator also produces the electrical signal that triggers the heart to beat. A pacemaker may be placed if:  You have a slow heartbeat (bradycardia).  You have fainting (syncope).  Shortness of breath (dyspnea) due to heart problems. LET [facility] CARE PROVIDER KNOW ABOUT:  Any allergies you may have.  All medicines you are taking, including vitamins, herbs, eye drops, creams, and over-the-counter medicines.  Previous problems you or members of your family have had with the use of anesthetics.  Any blood disorders you have.  Previous surgeries you have had.  Medical conditions you have.  Possibility of pregnancy, if this applies. RISKS AND COMPLICATIONS Generally, pacemaker implantation is a safe procedure.  However, problems can occur and include:  Bleeding.  Unable to place the pacemaker under local sedation.  Infection. BEFORE THE PROCEDURE  You will have blood work drawn before the procedure.  Do not use any tobacco products including cigarettes, chewing tobacco, or electronic cigarettes. If you need help quitting, ask your health care provider.  Do not eat or drink anything after midnight on the night before the procedure or as directed by your health care provider.  Ask your health care provider about:  Changing or stopping your regular medicines. This is especially important if you are taking diabetes medicines or blood thinners.  Taking medicines such as aspirin and ibuprofen. These medicines can thin your blood. Do not take these medicines before your procedure if your health care provider asks you not to.  Ask your health care provider if you can take a sip of water with any approved medicines the morning of the procedure. PROCEDURE  The surgery to place a pacemaker is considered a minimally invasive surgical procedure. It is done under a local anesthetic, which is an injection at the incision site that makes the skin numb. You are also given sedation and pain medicine that makes you drowsy during the procedure.   An intravenous line (IV) will be started in your hand or arm so sedation and pain medicine can be given during the pacemaker procedure.  A numbing medicine will be injected into the skin where the pacemaker is to be placed. A small incision will then be made into the skin. The pacemaker is usually placed under the skin near the collarbone.  After the incision has been made, the leads will  be inserted into a large vein and guided into the heart using X-ray.  Using the same incision that was used to place the leads, a small pocket will be created under the skin to hold the pulse generator. The leads will then be connected to the pulse generator.  The incision site will  then be closed. A bandage (dressing) is placed over the pacemaker site. The dressing is removed 24-48 hours afterward. AFTER THE PROCEDURE  You will be taken to a recovery area after the pacemaker implant. Your vital signs such as blood pressure, heart rate, breathing, and oxygen levels will be monitored.  A chest X-ray will be done after the pacemaker has been implanted. This is to make sure the pacemaker and leads are in the correct place. Document Released: 12/26/2001 Document Revised: 05/22/2013 Document Reviewed: 05/12/2011 [facility] Patient Information 2015 [place], [place]. This information is not intended to replace advice given to you by your health care provider. Make sure you discuss any questions you have with your health care provider.

## 2014-07-24 ENCOUNTER — Ambulatory Visit (HOSPITAL_COMMUNITY)
Admission: RE | Admit: 2014-07-24 | Discharge: 2014-07-26 | Disposition: A | Discharge status: Home / Self Care | Payer: Medicare Other | Source: Ambulatory Visit | Attending: Internal Medicine | Admitting: Internal Medicine

## 2014-07-24 ENCOUNTER — Ambulatory Visit (HOSPITAL_COMMUNITY): Payer: Medicare Other

## 2014-07-24 ENCOUNTER — Encounter (HOSPITAL_COMMUNITY): Payer: Self-pay | Admitting: General Practice

## 2014-07-24 ENCOUNTER — Encounter (HOSPITAL_COMMUNITY)
Admission: RE | Disposition: A | Discharge status: Home / Self Care | Payer: Self-pay | Source: Ambulatory Visit | Attending: Internal Medicine

## 2014-07-24 DIAGNOSIS — I441 Atrioventricular block, second degree: Secondary | ICD-10-CM | POA: Diagnosis not present

## 2014-07-24 DIAGNOSIS — R079 Chest pain, unspecified: Secondary | ICD-10-CM

## 2014-07-24 DIAGNOSIS — I480 Paroxysmal atrial fibrillation: Secondary | ICD-10-CM

## 2014-07-24 DIAGNOSIS — D696 Thrombocytopenia, unspecified: Secondary | ICD-10-CM | POA: Insufficient documentation

## 2014-07-24 DIAGNOSIS — I495 Sick sinus syndrome: Secondary | ICD-10-CM

## 2014-07-24 DIAGNOSIS — Z7901 Long term (current) use of anticoagulants: Secondary | ICD-10-CM | POA: Diagnosis not present

## 2014-07-24 DIAGNOSIS — Z959 Presence of cardiac and vascular implant and graft, unspecified: Secondary | ICD-10-CM

## 2014-07-24 DIAGNOSIS — T82120A Displacement of cardiac electrode, initial encounter: Secondary | ICD-10-CM | POA: Diagnosis present

## 2014-07-24 DIAGNOSIS — Z950 Presence of cardiac pacemaker: Secondary | ICD-10-CM

## 2014-07-24 HISTORY — PX: EP IMPLANTABLE DEVICE: SHX172B

## 2014-07-24 HISTORY — DX: Family history of other specified conditions: Z84.89

## 2014-07-24 LAB — SURGICAL PCR SCREEN
MRSA, PCR: NEGATIVE
Staphylococcus aureus: POSITIVE — AB

## 2014-07-24 SURGERY — PACEMAKER IMPLANT
Anesthesia: LOCAL

## 2014-07-24 MED ORDER — CEFAZOLIN SODIUM-DEXTROSE 2-3 GM-% IV SOLR
2.0000 g | INTRAVENOUS | Status: AC
  Administered 2014-07-24: 2 g via INTRAVENOUS

## 2014-07-24 MED ORDER — CEFAZOLIN SODIUM-DEXTROSE 2-3 GM-% IV SOLR
INTRAVENOUS | Status: AC
  Filled 2014-07-24: qty 50

## 2014-07-24 MED ORDER — MIDAZOLAM HCL 5 MG/5ML IJ SOLN
INTRAMUSCULAR | Status: AC
  Filled 2014-07-24: qty 5

## 2014-07-24 MED ORDER — MUPIROCIN 2 % EX OINT
TOPICAL_OINTMENT | CUTANEOUS | Status: AC
  Administered 2014-07-24: 1 via TOPICAL
  Filled 2014-07-24: qty 22

## 2014-07-24 MED ORDER — HEPARIN (PORCINE) IN NACL 2-0.9 UNIT/ML-% IJ SOLN
INTRAMUSCULAR | Status: AC
  Filled 2014-07-24: qty 500

## 2014-07-24 MED ORDER — IOHEXOL 350 MG/ML SOLN
INTRAVENOUS | Status: DC | PRN
  Administered 2014-07-24: 15 mL via INTRAVENOUS

## 2014-07-24 MED ORDER — ACETAMINOPHEN 325 MG PO TABS
325.0000 mg | ORAL_TABLET | ORAL | Status: DC | PRN
  Administered 2014-07-24 – 2014-07-25 (×2): 650 mg via ORAL
  Filled 2014-07-24 (×2): qty 2

## 2014-07-24 MED ORDER — SODIUM CHLORIDE 0.9 % IR SOLN
Status: AC
  Filled 2014-07-24: qty 2

## 2014-07-24 MED ORDER — FUROSEMIDE 40 MG PO TABS
40.0000 mg | ORAL_TABLET | Freq: Every day | ORAL | Status: DC
  Administered 2014-07-25: 40 mg via ORAL
  Filled 2014-07-24 (×2): qty 1

## 2014-07-24 MED ORDER — HYDROCODONE-ACETAMINOPHEN 5-325 MG PO TABS: 1.0000 | ORAL_TABLET | ORAL | Status: DC | PRN

## 2014-07-24 MED ORDER — SODIUM CHLORIDE 0.9 % IJ SOLN: 3.0000 mL | INTRAMUSCULAR | Status: DC | PRN

## 2014-07-24 MED ORDER — ONDANSETRON HCL 4 MG/2ML IJ SOLN: 4.0000 mg | Freq: Four times a day (QID) | INTRAMUSCULAR | Status: DC | PRN

## 2014-07-24 MED ORDER — LIDOCAINE HCL (PF) 1 % IJ SOLN
INTRAMUSCULAR | Status: AC
  Filled 2014-07-24: qty 60

## 2014-07-24 MED ORDER — CHLORHEXIDINE GLUCONATE 4 % EX LIQD
60.0000 mL | Freq: Once | CUTANEOUS | Status: DC
  Filled 2014-07-24: qty 60

## 2014-07-24 MED ORDER — SODIUM CHLORIDE 0.9 % IR SOLN
Status: DC | PRN
  Administered 2014-07-24: 440 mL

## 2014-07-24 MED ORDER — CEFAZOLIN SODIUM 1-5 GM-% IV SOLN
1.0000 g | Freq: Four times a day (QID) | INTRAVENOUS | Status: AC
  Administered 2014-07-24 – 2014-07-25 (×2): 1 g via INTRAVENOUS
  Filled 2014-07-24 (×3): qty 50

## 2014-07-24 MED ORDER — SODIUM CHLORIDE 0.9 % IV SOLN
INTRAVENOUS | Status: DC
  Administered 2014-07-24: 14:00:00 via INTRAVENOUS

## 2014-07-24 MED ORDER — SODIUM CHLORIDE 0.9 % IV SOLN: 250.0000 mL | INTRAVENOUS | Status: DC | PRN

## 2014-07-24 MED ORDER — MUPIROCIN 2 % EX OINT
1.0000 "application " | TOPICAL_OINTMENT | Freq: Once | CUTANEOUS | Status: AC
  Administered 2014-07-24: 1 via TOPICAL
  Filled 2014-07-24: qty 22

## 2014-07-24 MED ORDER — SODIUM CHLORIDE 0.9 % IJ SOLN
3.0000 mL | Freq: Two times a day (BID) | INTRAMUSCULAR | Status: DC
  Administered 2014-07-25 (×2): 3 mL via INTRAVENOUS

## 2014-07-24 MED ORDER — HYDRALAZINE HCL 20 MG/ML IJ SOLN
10.0000 mg | INTRAMUSCULAR | Status: DC | PRN
  Filled 2014-07-24: qty 1

## 2014-07-24 MED ORDER — SODIUM CHLORIDE 0.9 % IR SOLN: 80.0000 mg | Status: DC

## 2014-07-24 MED ORDER — LIDOCAINE HCL (PF) 1 % IJ SOLN
INTRAMUSCULAR | Status: DC | PRN
  Administered 2014-07-24: 30 mL via SUBCUTANEOUS

## 2014-07-24 MED ORDER — FENTANYL CITRATE (PF) 100 MCG/2ML IJ SOLN
INTRAMUSCULAR | Status: AC
  Filled 2014-07-24: qty 2

## 2014-07-24 SURGICAL SUPPLY — 7 items
CABLE SURGICAL S-101-97-12 (CABLE) ×1 IMPLANT
LEAD ISOFLEX OPT 1948-58CM (Lead) ×1 IMPLANT
LEAD TENDRIL SDX 2088TC-52CM (Lead) ×1 IMPLANT
PAD DEFIB LIFELINK (PAD) ×1 IMPLANT
PPM ASSURITY DR PM2240 (Pacemaker) ×1 IMPLANT
SHEATH CLASSIC 7F (SHEATH) ×2 IMPLANT
TRAY PACEMAKER INSERTION (CUSTOM PROCEDURE TRAY) ×1 IMPLANT

## 2014-07-24 NOTE — Discharge Summary (Deleted)
ELECTROPHYSIOLOGY PROCEDURE DISCHARGE SUMMARY    Patient ID: Matthew MainsJohn N Renteria,  MRN: [id number], DOB/AGE: 10/17/1931 81 y.o.  Admit date: 07/24/2014 Discharge date: 07/25/2014  Primary Care Physician: Ginette OttoSTONEKING,HAL THOMAS, MD Primary [facility]ologist[facility] Electrophysiologist: Allred  Primary Discharge Diagnosis:  Symptomatic bradycardia status post pacemaker implantation this admission  Secondary Discharge Diagnosis:  1.  Thrombocytopenia 2.  Atrial fibrillation 3.  Macular degeneration  Allergies  Allergen Reactions  . Adhesive [Tape] Other (See Comments)    Makes skin raw - please use paper tape  . Codeine Other (See Comments)    "makes him crazy"     Procedures This Admission:  1.  Echocardiogram on 07/24/14 demonstrated EF 60-65%, mild LVH, no RWMA, moderate MR, moderate TR. 2.  Implantation of a STJ dual chamber PPM on 07/24/14 by Dr Johney FrameAllred.  The patient received a STJ model number Assurity PPM with model number 2088 right atrial lead and 1948 right ventricular lead. There were no immediate post procedure complications. 2.  CXR on 07/25/14 demonstrated no pneumothorax status post device implantation.   Brief HPI: Matthew [name] is a 81 y.o. male with a past medical history as outlined above.  He presented to the office last week with complaints of exercise intolerance and weight gain.  He was found to be bradycardic with junctional escape beats. The patient has had symptomatic bradycardia without reversible causes identified.  Risks, benefits, and alternatives to PPM implantation were reviewed with the patient who wished to proceed.   Hospital Course:  The patient was admitted and underwent implantation of a STJ dual chamber pacemaker with details as outlined above.  He was monitored on telemetry overnight which demonstrated AV pacing.  Left chest was without hematoma or ecchymosis.  The device was interrogated and found to be functioning normally.  CXR was obtained and demonstrated  no pneumothorax status post device implantation.  Wound care, arm mobility, and restrictions were reviewed with the patient.  The patient was examined and considered stable for discharge to home.    Physical Exam: Filed Vitals:   07/24/14 1925 07/24/14 2108 07/25/14 0039 07/25/14 0500  BP: 160/78 147/77 145/82 156/84  Pulse:  65 59 60  Temp:  97.9 F (36.6 C) 98.5 F (36.9 C) 98.1 F (36.7 C)  TempSrc:  Oral Oral Oral  Resp: 14 16 16 16   Height:      Weight:    183 lb 9.6 oz (83.28 kg)  SpO2:  98% 98% 98%    GEN- The patient is well appearing, alert and oriented x 3 today.   HEENT: normocephalic, atraumatic; sclera clear, conjunctiva pink; hearing intact; oropharynx clear; neck supple, no JVP Lymph- no cervical lymphadenopathy Lungs- Clear to ausculation bilaterally, normal work of breathing.  No wheezes, rales, rhonchi Heart- Regular rate and rhythm, no murmurs, rubs or gallops  GI- soft, non-tender, non-distended, bowel sounds present Extremities- no clubbing, cyanosis, or edema  MS- no significant deformity or atrophy Skin- warm and dry, no rash or lesion, left chest without hematoma/ecchymosis Psych- euthymic mood, full affect Neuro- strength and sensation are intact   Labs:   Lab Results  Component Value Date   WBC 6.6 07/20/2014   HGB 12.6* 07/20/2014   HCT 36.9* 07/20/2014   MCV 92.0 07/20/2014   PLT 97* 07/20/2014     Recent Labs Lab 07/25/14 0429  NA 139  K 4.0  CL 107  CO2 25  BUN 17  CREATININE 1.10  CALCIUM 8.7*  GLUCOSE 97  Discharge Medications:    Medication List    TAKE these medications        acetaminophen 325 MG tablet  Commonly known as:  TYLENOL  Take 325 mg by mouth daily as needed (pain).     fluticasone 50 MCG/ACT nasal spray  Commonly known as:  FLONASE  Place 1-2 sprays into both nostrils at bedtime as needed (congestion).     furosemide 40 MG tablet  Commonly known as:  LASIX  Take 1 tablet (40 mg total) by mouth  daily.     GLUCOSAMINE CHONDROITIN VIT D3 Caps  Take 1 capsule by mouth every other day. 1500 mg glucosamine, 1200 mg chondroitin, 1000 units vitamin D     Lutein 6 MG Tabs  Take 6 mg by mouth daily.     rivaroxaban 20 MG Tabs tablet  Commonly known as:  XARELTO  Take 1 tablet (20 mg total) by mouth daily.     sildenafil 100 MG tablet  Commonly known as:  VIAGRA  Take 100 mg by mouth daily as needed for erectile dysfunction.        Disposition:  Discharge Instructions    Diet - low sodium heart healthy    Complete by:  As directed      Increase activity slowly    Complete by:  As directed           Follow-up Information    Follow up with [facility] On 08/08/2014.   Why:  at 11am for wound check   Contact information:   [address] [place] [address]       Follow up with Donato Schultz, MD On 09/14/2014.   Specialty:  Cardiology   Why:  at 10AM   Contact information:   [address] [place] [address] [phone]       Follow up with Hillis Range, MD On 10/29/2014.   Specialty:  Cardiology   Why:  at [facility] information:   [address] [place] [address] [phone]       Duration of Discharge Encounter: Greater than 30 minutes including physician time.  Signed, Gypsy Balsam, NP 07/25/2014 6:52 AM

## 2014-07-24 NOTE — Interval H&P Note (Signed)
History and Physical Interval Note:  07/24/2014 1:55 PM  Matthew [name]  has presented today for surgery, with the diagnosis of heart block  The various methods of treatment have been discussed with the patient and family. After consideration of risks, benefits and other options for treatment, the patient has consented to  Procedure(s): Pacemaker Implant (N/A) as a surgical intervention .  The patient's history has been reviewed, patient examined, no change in status, stable for surgery.  I have reviewed the patient's chart and labs.  Questions were answered to the patient's satisfaction.     Matthew [name]

## 2014-07-24 NOTE — Progress Notes (Signed)
  Echocardiogram 2D Echocardiogram has been performed.  Matthew SavoyCasey N Haani Bakula 07/24/2014, 2:39 PM

## 2014-07-24 NOTE — Consult Note (Signed)
ELECTROPHYSIOLOGY CONSULT NOTE    Patient ID: Matthew [name] MRN: [id number], DOB/AGE: Jun 12, 1931 81 y.o.  Admit date: 07/24/2014 Date of Consult: 07/24/2014  Primary Physician: Ginette OttoSTONEKING,HAL THOMAS, MD Primary Cardiologist: Anne FuSkains Electrophysiologist: Nani Ingram  Reason for Consultation: symptomatic bradycardia  HPI:  Matthew MainsJohn N Baskerville is a 81 y.o. male with a past medical history significant for paroxysmal atrial fibrillation, sick sinus syndrome, macular degeneration, and thrombocytopenia.  He presented to the office last week with a 4 pound weight gain and decreased exercise tolerance.  He was found to be in sinus bradycardia with Mobitz 1 heart block with ventricular rates in the 30's and referred today for pacemaker implantation. The patient states that his exercise tolerance has decreased over the past several weeks.  He has also noticed weight gain and dyspnea.  He denies chest pain, recent fevers, chills, nausea or vomiting.    Last echo 2013 demonstrated normal LV function, moderate LA enlargement. Repeat echo pending today.  Lab work is remarkable for TSH of 5.356, normal FT4.    Past Medical History  Diagnosis Date  . Rosacea   . Macular degeneration   . Gilbert's syndrome   . Thrombocytopenia     Mild per Dr. Arbutus PedMohamed  . Bradycardia   . Venous insufficiency   . Atrial fibrillation   . Sick sinus syndrome      Surgical History:  Past Surgical History  Procedure Laterality Date  . Knee arthroscopy      right knee  . Tonsillectomy    . Vasectomy    . Endovascular laser  06-22-11    left EVLT  . Cardioversion  08/07/2011    Procedure: CARDIOVERSION;  Surgeon: Donato SchultzMark Skains, MD;  [facility];  Service: Cardiovascular;  Laterality: N/A;     Prescriptions prior to admission  Medication Sig Dispense Refill Last Dose  . acetaminophen (TYLENOL) 325 MG tablet Take 325 mg by mouth daily as needed (pain).     . fluticasone (FLONASE) 50 MCG/ACT nasal spray Place 1-2 sprays into both  nostrils at bedtime as needed (congestion).    Taking  . furosemide (LASIX) 40 MG tablet Take 1 tablet (40 mg total) by mouth daily. (Patient taking differently: Take 40 mg by mouth daily before supper. ) 30 tablet 3 Taking  . Lutein 6 MG TABS Take 6 mg by mouth daily.    Taking  . Misc Natural Products (GLUCOSAMINE CHONDROITIN VIT D3) CAPS Take 1 capsule by mouth every other day. 1500 mg glucosamine, 1200 mg chondroitin, 1000 units vitamin D     . rivaroxaban (XARELTO) 20 MG TABS tablet Take 1 tablet (20 mg total) by mouth daily. (Patient taking differently: Take 20 mg by mouth daily with supper. ) 30 tablet 6 Taking  . sildenafil (VIAGRA) 100 MG tablet Take 100 mg by mouth daily as needed for erectile dysfunction.    Taking    Inpatient Medications:  .  ceFAZolin (ANCEF) IV  2 g Intravenous To Cath  . chlorhexidine  60 mL Topical Once  . gentamicin irrigation  80 mg Irrigation To Cath  . mupirocin ointment  1 application Topical Once  . mupirocin ointment        Allergies:  Allergies  Allergen Reactions  . Adhesive [Tape] Other (See Comments)    Makes skin raw - please use paper tape  . Codeine Other (See Comments)    "makes him crazy"    History   Social History  . Marital Status: Single  Spouse Name: N/A  . Number of Children: N/A  . Years of Education: N/A   Occupational History  . Not on file.   Social History Main Topics  . Smoking status: Never Smoker   . Smokeless tobacco: Never Used  . Alcohol Use: Yes     Comment: rare  . Drug Use: No  . Sexual Activity: Not on file   Other Topics Concern  . Not on file   Social History Narrative     Family History  Problem Relation Age of Onset  . Cancer Brother     acute lymphocytic leukemia  . Hypertension Brother   . Heart disease Brother   . Diabetes Brother   . Cancer Mother   . Heart attack Brother   . Stroke Neg Hx      Review of Systems: All other systems reviewed and are otherwise negative except  as noted above.  Physical Exam: Filed Vitals:   07/24/14 1230  BP: 146/79  Pulse: 39  Temp: 98.2 F (36.8 C)  TempSrc: Oral  Resp: 17  Height:  (1.803 m)  Weight: 188 lb (85.276 kg)  SpO2: 100%    GEN- The patient is well appearing, alert and oriented x 3 today.   HEENT: normocephalic, atraumatic; sclera clear, conjunctiva pink; hearing intact; oropharynx clear; neck supple, no JVP Lymph- no cervical lymphadenopathy Lungs- Clear to ausculation bilaterally, normal work of breathing.  No wheezes, rales, rhonchi Heart- Bradycardic regular rate and rhythm, no murmurs, rubs or gallops  GI- soft, non-tender, non-distended, bowel sounds present  Extremities- no clubbing, cyanosis, or edema; DP/PT/radial pulses 2+ bilaterally MS- no significant deformity or atrophy Skin- warm and dry, no rash or lesion Psych- euthymic mood, full affect Neuro- strength and sensation are intact  Labs:   Lab Results  Component Value Date   WBC 6.6 07/20/2014   HGB 12.6* 07/20/2014   HCT 36.9* 07/20/2014   MCV 92.0 07/20/2014   PLT 97* 07/20/2014    Recent Labs Lab 07/20/14 1613  NA 142  K 4.1  CL 102  CO2 28  BUN 34*  CREATININE 1.22  CALCIUM 8.6  GLUCOSE 84      Radiology/Studies: No results found.  WUJ:WJXBJ bradycardia, rate 44, intermittent junctional rhythm  Assessment/Plan: 1.  Symptomatic sinus bradycardia The patient has symptomatic sinus bradycardia without reversible causes identified. TSH is elevated but FT4 is normal.  Risks, benefits to pacemaker implantation were reviewed with the patient who wishes to proceed.  Echo pending.  Will plan to proceed with pacemaker later today.   2.  Paroxysmal atrial fibrillation Maintaining SR by symptoms recently Continue Xarelto for CHADS2VASC of 2   Signed, Gypsy Balsam, NP 07/24/2014 1:24 PM  I have seen, examined the patient, and reviewed the above assessment and plan. On exam, he is bradycardic.  Given symptoms at this  time, I would advise PPM implantation.  Risks, benefits, alternatives to pacemaker implantation were discussed in detail with the patient today. The patient understands that the risks include but are not limited to bleeding, infection, pneumothorax, perforation, tamponade, vascular damage, renal failure, MI, stroke, death,  and lead dislodgement and wishes to proceed.  Co Sign: Hillis Range, MD 07/24/2014 1:51 PM

## 2014-07-24 NOTE — H&P (View-Only) (Signed)
ELECTROPHYSIOLOGY CONSULT NOTE    Patient ID: Matthew [name] MRN: [id number], DOB/AGE: Jun 12, 1931 81 y.o.  Admit date: 07/24/2014 Date of Consult: 07/24/2014  Primary Physician: Ginette OttoSTONEKING,HAL THOMAS, MD Primary Cardiologist: Anne FuSkains Electrophysiologist: Everitt Wenner  Reason for Consultation: symptomatic bradycardia  HPI:  Matthew [name] is a 81 y.o. male with a past medical history significant for paroxysmal atrial fibrillation, sick sinus syndrome, macular degeneration, and thrombocytopenia.  He presented to the office last week with a 4 pound weight gain and decreased exercise tolerance.  He was found to be in sinus bradycardia with Mobitz 1 heart block with ventricular rates in the 30's and referred today for pacemaker implantation. The patient states that his exercise tolerance has decreased over the past several weeks.  He has also noticed weight gain and dyspnea.  He denies chest pain, recent fevers, chills, nausea or vomiting.    Last echo 2013 demonstrated normal LV function, moderate LA enlargement. Repeat echo pending today.  Lab work is remarkable for TSH of 5.356, normal FT4.    Past Medical History  Diagnosis Date  . Rosacea   . Macular degeneration   . Gilbert's syndrome   . Thrombocytopenia     Mild per Dr. Arbutus PedMohamed  . Bradycardia   . Venous insufficiency   . Atrial fibrillation   . Sick sinus syndrome      Surgical History:  Past Surgical History  Procedure Laterality Date  . Knee arthroscopy      right knee  . Tonsillectomy    . Vasectomy    . Endovascular laser  06-22-11    left EVLT  . Cardioversion  08/07/2011    Procedure: CARDIOVERSION;  Surgeon: Donato SchultzMark Skains, MD;  Location: [facility] OR;  Service: Cardiovascular;  Laterality: N/A;     Prescriptions prior to admission  Medication Sig Dispense Refill Last Dose  . acetaminophen (TYLENOL) 325 MG tablet Take 325 mg by mouth daily as needed (pain).     . fluticasone (FLONASE) 50 MCG/ACT nasal spray Place 1-2 sprays into both  nostrils at bedtime as needed (congestion).    Taking  . furosemide (LASIX) 40 MG tablet Take 1 tablet (40 mg total) by mouth daily. (Patient taking differently: Take 40 mg by mouth daily before supper. ) 30 tablet 3 Taking  . Lutein 6 MG TABS Take 6 mg by mouth daily.    Taking  . Misc Natural Products (GLUCOSAMINE CHONDROITIN VIT D3) CAPS Take 1 capsule by mouth every other day. 1500 mg glucosamine, 1200 mg chondroitin, 1000 units vitamin D     . rivaroxaban (XARELTO) 20 MG TABS tablet Take 1 tablet (20 mg total) by mouth daily. (Patient taking differently: Take 20 mg by mouth daily with supper. ) 30 tablet 6 Taking  . sildenafil (VIAGRA) 100 MG tablet Take 100 mg by mouth daily as needed for erectile dysfunction.    Taking    Inpatient Medications:  .  ceFAZolin (ANCEF) IV  2 g Intravenous To Cath  . chlorhexidine  60 mL Topical Once  . gentamicin irrigation  80 mg Irrigation To Cath  . mupirocin ointment  1 application Topical Once  . mupirocin ointment        Allergies:  Allergies  Allergen Reactions  . Adhesive [Tape] Other (See Comments)    Makes skin raw - please use paper tape  . Codeine Other (See Comments)    "makes him crazy"    History   Social History  . Marital Status: Single  Spouse Name: N/A  . Number of Children: N/A  . Years of Education: N/A   Occupational History  . Not on file.   Social History Main Topics  . Smoking status: Never Smoker   . Smokeless tobacco: Never Used  . Alcohol Use: Yes     Comment: rare  . Drug Use: No  . Sexual Activity: Not on file   Other Topics Concern  . Not on file   Social History Narrative     Family History  Problem Relation Age of Onset  . Cancer Brother     acute lymphocytic leukemia  . Hypertension Brother   . Heart disease Brother   . Diabetes Brother   . Cancer Mother   . Heart attack Brother   . Stroke Neg Hx      Review of Systems: All other systems reviewed and are otherwise negative except  as noted above.  Physical Exam: Filed Vitals:   07/24/14 1230  BP: 146/79  Pulse: 39  Temp: 98.2 F (36.8 C)  TempSrc: Oral  Resp: 17  Height: 5' 11" (1.803 m)  Weight: 188 lb (85.276 kg)  SpO2: 100%    GEN- The patient is well appearing, alert and oriented x 3 today.   HEENT: normocephalic, atraumatic; sclera clear, conjunctiva pink; hearing intact; oropharynx clear; neck supple, no JVP Lymph- no cervical lymphadenopathy Lungs- Clear to ausculation bilaterally, normal work of breathing.  No wheezes, rales, rhonchi Heart- Bradycardic regular rate and rhythm, no murmurs, rubs or gallops  GI- soft, non-tender, non-distended, bowel sounds present  Extremities- no clubbing, cyanosis, or edema; DP/PT/radial pulses 2+ bilaterally MS- no significant deformity or atrophy Skin- warm and dry, no rash or lesion Psych- euthymic mood, full affect Neuro- strength and sensation are intact  Labs:   Lab Results  Component Value Date   WBC 6.6 07/20/2014   HGB 12.6* 07/20/2014   HCT 36.9* 07/20/2014   MCV 92.0 07/20/2014   PLT 97* 07/20/2014    Recent Labs Lab 07/20/14 1613  NA 142  K 4.1  CL 102  CO2 28  BUN 34*  CREATININE 1.22  CALCIUM 8.6  GLUCOSE 84      Radiology/Studies: No results found.  EKG:sinus bradycardia, rate 44, intermittent junctional rhythm  Assessment/Plan: 1.  Symptomatic sinus bradycardia The patient has symptomatic sinus bradycardia without reversible causes identified. TSH is elevated but FT4 is normal.  Risks, benefits to pacemaker implantation were reviewed with the patient who wishes to proceed.  Echo pending.  Will plan to proceed with pacemaker later today.   2.  Paroxysmal atrial fibrillation Maintaining SR by symptoms recently Continue Xarelto for CHADS2VASC of 2   Signed, Amber Seiler, NP 07/24/2014 1:24 PM  I have seen, examined the patient, and reviewed the above assessment and plan. On exam, he is bradycardic.  Given symptoms at this  time, I would advise PPM implantation.  Risks, benefits, alternatives to pacemaker implantation were discussed in detail with the patient today. The patient understands that the risks include but are not limited to bleeding, infection, pneumothorax, perforation, tamponade, vascular damage, renal failure, MI, stroke, death,  and lead dislodgement and wishes to proceed.  Co Sign: Tywanda Rice, MD 07/24/2014 1:51 PM   

## 2014-07-25 ENCOUNTER — Encounter (HOSPITAL_COMMUNITY): Payer: Self-pay | Admitting: Internal Medicine

## 2014-07-25 ENCOUNTER — Encounter (HOSPITAL_COMMUNITY)
Admission: RE | Disposition: A | Discharge status: Home / Self Care | Payer: Medicare Other | Source: Ambulatory Visit | Attending: Internal Medicine

## 2014-07-25 ENCOUNTER — Ambulatory Visit (HOSPITAL_COMMUNITY): Payer: Medicare Other

## 2014-07-25 DIAGNOSIS — T82120A Displacement of cardiac electrode, initial encounter: Secondary | ICD-10-CM | POA: Diagnosis not present

## 2014-07-25 DIAGNOSIS — D696 Thrombocytopenia, unspecified: Secondary | ICD-10-CM | POA: Diagnosis not present

## 2014-07-25 DIAGNOSIS — I480 Paroxysmal atrial fibrillation: Secondary | ICD-10-CM | POA: Diagnosis not present

## 2014-07-25 DIAGNOSIS — I495 Sick sinus syndrome: Secondary | ICD-10-CM | POA: Diagnosis not present

## 2014-07-25 HISTORY — PX: EP IMPLANTABLE DEVICE: SHX172B

## 2014-07-25 LAB — BASIC METABOLIC PANEL
Anion gap: 7 (ref 5–15)
BUN: 17 mg/dL (ref 6–20)
CO2: 25 mmol/L (ref 22–32)
CREATININE: 1.1 mg/dL (ref 0.61–1.24)
Calcium: 8.7 mg/dL — ABNORMAL LOW (ref 8.9–10.3)
Chloride: 107 mmol/L (ref 101–111)
GFR calc non Af Amer: >60 mL/min (ref >60)
GLUCOSE: 97 mg/dL (ref 65–99)
POTASSIUM: 4 mmol/L (ref 3.5–5.1)
Sodium: 139 mmol/L (ref 135–145)

## 2014-07-25 SURGERY — LEAD REVISION/REPAIR
Anesthesia: LOCAL

## 2014-07-25 MED ORDER — CHLORHEXIDINE GLUCONATE 4 % EX LIQD: 60.0000 mL | Freq: Once | CUTANEOUS | Status: DC

## 2014-07-25 MED ORDER — LIDOCAINE HCL (PF) 1 % IJ SOLN
INTRAMUSCULAR | Status: AC
  Filled 2014-07-25: qty 30

## 2014-07-25 MED ORDER — SODIUM CHLORIDE 0.9 % IR SOLN
80.0000 mg | Status: AC
  Administered 2014-07-25: 80 mg

## 2014-07-25 MED ORDER — MIDAZOLAM HCL 5 MG/5ML IJ SOLN
INTRAMUSCULAR | Status: DC | PRN
  Administered 2014-07-25 (×4): 1 mg via INTRAVENOUS

## 2014-07-25 MED ORDER — ACETAMINOPHEN 325 MG PO TABS
325.0000 mg | ORAL_TABLET | ORAL | Status: DC | PRN
  Administered 2014-07-25: 650 mg via ORAL
  Filled 2014-07-25: qty 2

## 2014-07-25 MED ORDER — SODIUM CHLORIDE 0.9 % IR SOLN
Status: AC
  Filled 2014-07-25: qty 2

## 2014-07-25 MED ORDER — CEFAZOLIN SODIUM 1-5 GM-% IV SOLN
INTRAVENOUS | Status: DC | PRN
  Administered 2014-07-25 (×2): 1 g via INTRAVENOUS

## 2014-07-25 MED ORDER — CEFAZOLIN SODIUM-DEXTROSE 2-3 GM-% IV SOLR: INTRAVENOUS | Status: DC | PRN

## 2014-07-25 MED ORDER — CEFAZOLIN SODIUM 1-5 GM-% IV SOLN
1.0000 g | Freq: Four times a day (QID) | INTRAVENOUS | Status: AC
  Administered 2014-07-25 – 2014-07-26 (×3): 1 g via INTRAVENOUS
  Filled 2014-07-25 (×3): qty 50

## 2014-07-25 MED ORDER — MIDAZOLAM HCL 5 MG/5ML IJ SOLN
INTRAMUSCULAR | Status: AC
  Filled 2014-07-25: qty 5

## 2014-07-25 MED ORDER — CEFAZOLIN SODIUM 1-5 GM-% IV SOLN
INTRAVENOUS | Status: AC
  Filled 2014-07-25: qty 50

## 2014-07-25 MED ORDER — CEFAZOLIN SODIUM-DEXTROSE 2-3 GM-% IV SOLR: 2.0000 g | INTRAVENOUS | Status: DC

## 2014-07-25 MED ORDER — ONDANSETRON HCL 4 MG/2ML IJ SOLN: 4.0000 mg | Freq: Four times a day (QID) | INTRAMUSCULAR | Status: DC | PRN

## 2014-07-25 MED ORDER — SODIUM CHLORIDE 0.9 % IV SOLN: INTRAVENOUS | Status: DC

## 2014-07-25 MED ORDER — LIDOCAINE HCL (PF) 1 % IJ SOLN
INTRAMUSCULAR | Status: DC | PRN
  Administered 2014-07-25: 12:00:00

## 2014-07-25 MED ORDER — FENTANYL CITRATE (PF) 100 MCG/2ML IJ SOLN
INTRAMUSCULAR | Status: DC | PRN
  Administered 2014-07-25 (×2): 25 ug via INTRAVENOUS
  Administered 2014-07-25: 12.5 ug via INTRAVENOUS

## 2014-07-25 MED ORDER — FENTANYL CITRATE (PF) 100 MCG/2ML IJ SOLN
INTRAMUSCULAR | Status: AC
  Filled 2014-07-25: qty 2

## 2014-07-25 SURGICAL SUPPLY — 3 items
CABLE SURGICAL S-101-97-12 (CABLE) ×1 IMPLANT
PAD DEFIB LIFELINK (PAD) ×1 IMPLANT
TRAY PACEMAKER INSERTION (CUSTOM PROCEDURE TRAY) ×1 IMPLANT

## 2014-07-25 NOTE — Progress Notes (Signed)
CXR reveals that the RA lead has dislodged. Risks, benefits, and alternatives to lead revision were discussed with the patient who wishes to proceed. Will make NPO for lead revision by Dr Ladona Ridgelaylor later this am.  Hillis RangeJames Jan Olano MD, [facility] 07/25/2014 8:30 AM

## 2014-07-25 NOTE — Progress Notes (Signed)
Pt has a working PIV. Consuello Masseimmons, Joniqua Sidle M

## 2014-07-25 NOTE — Progress Notes (Signed)
    SUBJECTIVE: The patient is doing well today.  At this time, he denies chest pain, shortness of breath, or any new concerns.  Symptomatically improved post pacemaker  CURRENT MEDICATIONS: .  ceFAZolin (ANCEF) IV  1 g Intravenous Q6H  . furosemide  40 mg Oral QAC supper  . sodium chloride  3 mL Intravenous Q12H      OBJECTIVE: Physical Exam: Filed Vitals:   07/24/14 1925 07/24/14 2108 07/25/14 0039 07/25/14 0500  BP: 160/78 147/77 145/82 156/84  Pulse:  65 59 60  Temp:  97.9 F (36.6 C) 98.5 F (36.9 C) 98.1 F (36.7 C)  TempSrc:  Oral Oral Oral  Resp: 14 16 16 16  Height:      Weight:    183 lb 9.6 oz (83.28 kg)  SpO2:  98% 98% 98%    Intake/Output Summary (Last 24 hours) at 07/25/14 0722 Last data filed at 07/25/14 0039  Gross per 24 hour  Intake      0 ml  Output    750 ml  Net   -750 ml    Telemetry reveals AV pacing  GEN- The patient is well appearing, alert and oriented x 3 today.   Head- normocephalic, atraumatic Eyes-  Sclera clear, conjunctiva pink Ears- hearing intact Oropharynx- clear Neck- supple, no JVP Lymph- no cervical lymphadenopathy Lungs- Clear to ausculation bilaterally, normal work of breathing Heart- Regular rate and rhythm, no murmurs, rubs or gallops  GI- soft, NT, ND, + BS Extremities- no clubbing, cyanosis, or edema Skin- no rash or lesion Psych- euthymic mood, full affect Neuro- strength and sensation are intact  LABS: Basic Metabolic Panel:  Recent Labs  07/25/14 0429  NA 139  K 4.0  CL 107  CO2 25  GLUCOSE 97  BUN 17  CREATININE 1.10  CALCIUM 8.7*    ASSESSMENT AND PLAN:  Active Problems:   Sick sinus syndrome  1. SSS/symptomiatc bradycardia S/p PPM implant 07/24/14 CXR this morning demonstrates RA lead has dislodged into RV - still with capture, but at higher outputs.  Dr Allred discussed lead revision with patient who wishes to proceed. Will plan for later today with Dr Adileny Delon.  2.  Paroxysmal atrial  fibrillation Maintaining SR Resume Xarelto for CHADS2VASC of 2  Amber Seiler, NP 07/25/2014 7:24 AM  EP Attending  CXR and interogation results noted. His atrial lead has dislodged down near the TV annulus. Will plan to proceed with atrial lead revision later today. NPO after clear liquids.   Talynn Lebon,M.D. 

## 2014-07-25 NOTE — Interval H&P Note (Signed)
History and Physical Interval Note:  07/25/2014 10:36 AM  Matthew MainsJohn N Fryberger  has presented today for surgery, with the diagnosis of lead dislodgement  The various methods of treatment have been discussed with the patient and family. After consideration of risks, benefits and other options for treatment, the patient has consented to  Procedure(s): Lead Revision (N/A) as a surgical intervention .  The patient's history has been reviewed, patient examined, no change in status, stable for surgery.  I have reviewed the patient's chart and labs.  Questions were answered to the patient's satisfaction.     Lewayne BuntingGregg Breya Cass

## 2014-07-25 NOTE — H&P (View-Only) (Signed)
    SUBJECTIVE: The patient is doing well today.  At this time, he denies chest pain, shortness of breath, or any new concerns.  Symptomatically improved post pacemaker  CURRENT MEDICATIONS: .  ceFAZolin (ANCEF) IV  1 g Intravenous Q6H  . furosemide  40 mg Oral QAC supper  . sodium chloride  3 mL Intravenous Q12H      OBJECTIVE: Physical Exam: Filed Vitals:   07/24/14 1925 07/24/14 2108 07/25/14 0039 07/25/14 0500  BP: 160/78 147/77 145/82 156/84  Pulse:  65 59 60  Temp:  97.9 F (36.6 C) 98.5 F (36.9 C) 98.1 F (36.7 C)  TempSrc:  Oral Oral Oral  Resp: 14 16 16 16   Height:      Weight:    183 lb 9.6 oz (83.28 kg)  SpO2:  98% 98% 98%    Intake/Output Summary (Last 24 hours) at 07/25/14 16100722 Last data filed at 07/25/14 0039  Gross per 24 hour  Intake      0 ml  Output    750 ml  Net   -750 ml    Telemetry reveals AV pacing  GEN- The patient is well appearing, alert and oriented x 3 today.   Head- normocephalic, atraumatic Eyes-  Sclera clear, conjunctiva pink Ears- hearing intact Oropharynx- clear Neck- supple, no JVP Lymph- no cervical lymphadenopathy Lungs- Clear to ausculation bilaterally, normal work of breathing Heart- Regular rate and rhythm, no murmurs, rubs or gallops  GI- soft, NT, ND, + BS Extremities- no clubbing, cyanosis, or edema Skin- no rash or lesion Psych- euthymic mood, full affect Neuro- strength and sensation are intact  LABS: Basic Metabolic Panel:  Recent Labs  96/04/5405/06/16 0429  NA 139  K 4.0  CL 107  CO2 25  GLUCOSE 97  BUN 17  CREATININE 1.10  CALCIUM 8.7*    ASSESSMENT AND PLAN:  Active Problems:   Sick sinus syndrome  1. SSS/symptomiatc bradycardia S/p PPM implant 07/24/14 CXR this morning demonstrates RA lead has dislodged into RV - still with capture, but at higher outputs.  Dr Johney FrameAllred discussed lead revision with patient who wishes to proceed. Will plan for later today with Dr Ladona Ridgelaylor.  2.  Paroxysmal atrial  fibrillation Maintaining SR Resume Xarelto for CHADS2VASC of 2  Gypsy BalsamAmber Seiler, NP 07/25/2014 7:24 AM  EP Attending  CXR and interogation results noted. His atrial lead has dislodged down near the TV annulus. Will plan to proceed with atrial lead revision later today. NPO after clear liquids.   Leonia ReevesGregg Haisley Arens,M.D.

## 2014-07-26 ENCOUNTER — Ambulatory Visit (HOSPITAL_COMMUNITY): Payer: Medicare Other

## 2014-07-26 ENCOUNTER — Encounter (HOSPITAL_COMMUNITY): Payer: Self-pay | Admitting: Internal Medicine

## 2014-07-26 DIAGNOSIS — I480 Paroxysmal atrial fibrillation: Secondary | ICD-10-CM | POA: Diagnosis not present

## 2014-07-26 DIAGNOSIS — D696 Thrombocytopenia, unspecified: Secondary | ICD-10-CM | POA: Diagnosis not present

## 2014-07-26 DIAGNOSIS — T82120A Displacement of cardiac electrode, initial encounter: Secondary | ICD-10-CM | POA: Diagnosis not present

## 2014-07-26 DIAGNOSIS — I495 Sick sinus syndrome: Secondary | ICD-10-CM | POA: Diagnosis not present

## 2014-07-26 NOTE — Discharge Instructions (Addendum)
Supplemental Discharge Instructions for  Pacemaker/Defibrillator Patients  Activity No heavy lifting or vigorous activity with your left/right arm for 6 to 8 weeks.  Do not raise your left/right arm above your head for one week.  Gradually raise your affected arm as drawn below.           __        07/28/14                07/29/14                       07/30/14                  07/31/14  NO DRIVING for  1 week   ; you may begin driving on    9/52/847/12/16 .  WOUND CARE - Keep the wound area clean and dry.  Do not get this area wet for one week. No showers for one week; you may shower on  07/31/14   . - The tape/steri-strips on your wound will fall off; do not pull them off.  No bandage is needed on the site.  DO  NOT apply any creams, oils, or ointments to the wound area. - If you notice any drainage or discharge from the wound, any swelling or bruising at the site, or you develop a fever > 101? F after you are discharged home, call the office at once.  Special Instructions - You are still able to use cellular telephones; use the ear opposite the side where you have your pacemaker/defibrillator.  Avoid carrying your cellular phone near your device. - When traveling through airports, show security personnel your identification card to avoid being screened in the metal detectors.  Ask the security personnel to use the hand wand. - Avoid arc welding equipment, MRI testing (magnetic resonance imaging), TENS units (transcutaneous nerve stimulators).  Call the office for questions about other devices. - Avoid electrical appliances that are in poor condition or are not properly grounded. - Microwave ovens are safe to be near or to operate.   Cardiac Diet This diet can help prevent heart disease and stroke. Many factors influence your heart health, including eating and exercise habits. Coronary risk rises a lot with abnormal blood fat (lipid) levels. Cardiac meal planning includes limiting unhealthy  fats, increasing healthy fats, and making other small dietary changes. General guidelines are as follows:  Adjust calorie intake to reach and maintain desirable body weight.  Limit total fat intake to less than 30% of total calories. Saturated fat should be less than 7% of calories.  Saturated fats are found in animal products and in some vegetable products. Saturated vegetable fats are found in coconut oil, cocoa butter, palm oil, and palm kernel oil. Read labels carefully to avoid these products as much as possible. Use butter in moderation. Choose tub margarines and oils that have 2 grams of fat or less. Good cooking oils are canola and olive oils.  Practice low-fat cooking techniques. Do not fry food. Instead, broil, bake, boil, steam, grill, roast on a rack, stir-fry, or microwave it. Other fat reducing suggestions include:  Remove the skin from poultry.  Remove all visible fat from meats.  Skim the fat off stews, soups, and gravies before serving them.  Steam vegetables in water or broth instead of sauting them in fat.  Avoid foods with trans fat (or hydrogenated oils), such as commercially fried foods and commercially baked goods. OceanographerCommercial  shortening and deep-frying fats will contain trans fat.  Increase intake of fruits, vegetables, whole grains, and legumes to replace foods high in fat.  Increase consumption of nuts, legumes, and seeds to at least 4 servings weekly. One serving of a legume equals  cup, and 1 serving of nuts or seeds equals  cup.  Choose whole grains more often. Have 3 servings per day (a serving is 1 ounce [oz]).  Eat 4 to 5 servings of vegetables per day. A serving of vegetables is 1 cup of raw leafy vegetables;  cup of raw or cooked cut-up vegetables;  cup of vegetable juice.  Eat 4 to 5 servings of fruit per day. A serving of fruit is 1 medium whole fruit;  cup of dried fruit;  cup of fresh, frozen, or canned fruit;  cup of 100% fruit  juice.  Increase your intake of dietary fiber to 20 to 30 grams per day. Insoluble fiber may help lower your risk of heart disease and may help curb your appetite.  Soluble fiber binds cholesterol to be removed from the blood. Foods high in soluble fiber are dried beans, citrus fruits, oats, apples, bananas, broccoli, Brussels sprouts, and eggplant.  Try to include foods fortified with plant sterols or stanols, such as yogurt, breads, juices, or margarines. Choose several fortified foods to achieve a daily intake of 2 to 3 grams of plant sterols or stanols.  Foods with omega-3 fats can help reduce your risk of heart disease. Aim to have a 3.5 oz portion of fatty fish twice per week, such as salmon, mackerel, albacore tuna, sardines, lake trout, or herring. If you wish to take a fish oil supplement, choose one that contains 1 gram of both DHA and EPA.  Limit processed meats to 2 servings (3 oz portion) weekly.  Limit the sodium in your diet to 1500 milligrams (mg) per day. If you have high blood pressure, talk to a registered dietitian about a DASH (Dietary Approaches to Stop Hypertension) eating plan.  Limit sweets and beverages with added sugar, such as soda, to no more than 5 servings per week. One serving is:   1 tablespoon sugar.  1 tablespoon jelly or jam.   cup sorbet.  1 cup lemonade.   cup regular soda. CHOOSING FOODS Starches  Allowed: Breads: All kinds (wheat, rye, raisin, white, oatmeal, [place], [place], and English muffin bread). Low-fat rolls: English muffins, frankfurter and hamburger buns, bagels, pita bread, tortillas (not fried). Pancakes, waffles, biscuits, and muffins made with recommended oil.  Avoid: Products made with saturated or trans fats, oils, or whole milk products. Butter rolls, cheese breads, croissants. Commercial doughnuts, muffins, sweet rolls, biscuits, waffles, pancakes, store-bought mixes. Crackers  Allowed: Low-fat crackers and snacks: Animal,  graham, rye, saltine (with recommended oil, no lard), oyster, and matzo crackers. Bread sticks, melba toast, rusks, flatbread, pretzels, and light popcorn.  Avoid: High-fat crackers: cheese crackers, butter crackers, and those made with coconut, palm oil, or trans fat (hydrogenated oils). Buttered popcorn. Cereals  Allowed: Hot or cold whole-grain cereals.  Avoid: Cereals containing coconut, hydrogenated vegetable fat, or animal fat. Potatoes / Pasta / Rice  Allowed: All kinds of potatoes, rice, and pasta (such as macaroni, spaghetti, and noodles).  Avoid: Pasta or rice prepared with cream sauce or high-fat cheese. Chow mein noodles, [place] fries. Vegetables  Allowed: All vegetables and vegetable juices.  Avoid: Fried vegetables. Vegetables in cream, butter, or high-fat cheese sauces. Limit coconut. Fruit in cream or custard. Protein  Allowed:  Limit your intake of meat, seafood, and poultry to no more than 6 oz (cooked weight) per day. All lean, well-trimmed beef, veal, pork, and lamb. All chicken and [place] without skin. All fish and shellfish. Wild game: wild duck, rabbit, pheasant, and venison. Egg whites or low-cholesterol egg substitutes may be used as desired. Meatless dishes: recipes with dried beans, peas, lentils, and tofu (soybean curd). Seeds and nuts: all seeds and most nuts.  Avoid: Prime grade and other heavily marbled and fatty meats, such as short ribs, spare ribs, rib eye roast or steak, frankfurters, sausage, bacon, and high-fat luncheon meats, mutton. Caviar. Commercially fried fish. Domestic duck, goose, venison sausage. Organ meats: liver, gizzard, heart, chitterlings, brains, kidney, sweetbreads. Dairy  Allowed: Low-fat cheeses: nonfat or low-fat cottage cheese (1% or 2% fat), cheeses made with part skim milk, such as mozzarella, farmers, string, or ricotta. (Cheeses should be labeled no more than 2 to 6 grams fat per oz.). Skim (or 1%) milk: liquid, powdered, or  evaporated. Buttermilk made with low-fat milk. Drinks made with skim or low-fat milk or cocoa. Chocolate milk or cocoa made with skim or low-fat (1%) milk. Nonfat or low-fat yogurt.  Avoid: Whole milk cheeses, including colby, cheddar, [place], [address], [place], [place], [address], [address], Swiss, and blue. Creamed cottage cheese, cream cheese. Whole milk and whole milk products, including buttermilk or yogurt made from whole milk, drinks made from whole milk. Condensed milk, evaporated whole milk, and 2% milk. Soups and Combination Foods  Allowed: Low-fat low-sodium soups: broth, dehydrated soups, homemade broth, soups with the fat removed, homemade cream soups made with skim or low-fat milk. Low-fat spaghetti, lasagna, chili, and Spanish rice if low-fat ingredients and low-fat cooking techniques are used.  Avoid: Cream soups made with whole milk, cream, or high-fat cheese. All other soups. Desserts and Sweets  Allowed: Sherbet, fruit ices, gelatins, meringues, and angel food cake. Homemade desserts with recommended fats, oils, and milk products. Jam, jelly, honey, marmalade, sugars, and syrups. Pure sugar candy, such as gum drops, hard candy, jelly beans, marshmallows, mints, and small amounts of dark chocolate.  Avoid: Commercially prepared cakes, pies, cookies, frosting, pudding, or mixes for these products. Desserts containing whole milk products, chocolate, coconut, lard, palm oil, or palm kernel oil. Ice cream or ice cream drinks. Candy that contains chocolate, coconut, butter, hydrogenated fat, or unknown ingredients. Buttered syrups. Fats and Oils  Allowed: Vegetable oils: safflower, sunflower, corn, soybean, cottonseed, sesame, canola, olive, or peanut. Non-hydrogenated margarines. Salad dressing or mayonnaise: homemade or commercial, made with a recommended oil. Low or nonfat salad dressing or mayonnaise.  Limit added fats and oils to 6 to 8 tsp per day (includes fats used in  cooking, baking, salads, and spreads on bread). Remember to count the "hidden fats" in foods.  Avoid: Solid fats and shortenings: butter, lard, salt pork, bacon drippings. Gravy containing meat fat, shortening, or suet. Cocoa butter, coconut. Coconut oil, palm oil, palm kernel oil, or hydrogenated oils: these ingredients are often used in bakery products, nondairy creamers, whipped toppings, candy, and commercially fried foods. Read labels carefully. Salad dressings made of unknown oils, sour cream, or cheese, such as blue cheese and Roquefort. Cream, all kinds: half-and-half, light, heavy, or whipping. Sour cream or cream cheese (even if "light" or low-fat). Nondairy cream substitutes: coffee creamers and sour cream substitutes made with palm, palm kernel, hydrogenated oils, or coconut oil. Beverages  Allowed: Coffee (regular or decaffeinated), tea. Diet carbonated beverages, mineral water. Alcohol: Check with your  caregiver. Moderation is recommended.  Avoid: Whole milk, regular sodas, and juice drinks with added sugar. Condiments  Allowed: All seasonings and condiments. Cocoa powder. "Cream" sauces made with recommended ingredients.  Avoid: Carob powder made with hydrogenated fats. SAMPLE MENU Breakfast   cup orange juice   cup oatmeal  1 slice toast  1 tsp margarine  1 cup skim milk Lunch  [place] sandwich with 2 oz [place], 2 slices bread  Lettuce and tomato slices  Fresh fruit  Carrot sticks  Coffee or tea Snack  Fresh fruit or low-fat crackers Dinner  3 oz lean ground beef  1 baked potato  1 tsp margarine   cup asparagus  Lettuce salad  1 tbs non-creamy dressing   cup peach slices  1 cup skim milk Document Released: 10/15/2007 Document Revised: 07/07/2011 Document Reviewed: 03/07/2013 Exit[employer] Patient Information 2015 [place], [place]. This information is not intended to replace advice given to you by your health care provider. Make sure you discuss  any questions you have with your health care provider.

## 2014-07-26 NOTE — Discharge Summary (Signed)
Physician Discharge Summary     Cardiologist:  Allred Patient ID: Matthew [name] MRN: [id number] DOB/AGE: 04/02/1931 81 y.o.  Admit date: 07/24/2014 Discharge date: 07/26/2014  Admission Diagnoses:  Atrial pacemaker lead displacement   Discharge Diagnoses:  Active Problems:   Sick sinus syndrome   Atrial pacemaker lead displacement   Discharged Condition: stable  Hospital Course:  Matthew [name] is a 81 y.o. male with a past medical history significant for paroxysmal atrial fibrillation, sick sinus syndrome, macular degeneration, and thrombocytopenia. He presented to the office last week with a 4 pound weight gain and decreased exercise tolerance. He was found to be in sinus bradycardia with Mobitz 1 heart block with ventricular rates in the 30's and referred today for pacemaker implantation. The patient states that his exercise tolerance has decreased over the past several weeks. He has also noticed weight gain and dyspnea. He denies chest pain, recent fevers, chills, nausea or vomiting.   Last echo 2016 demonstrated normal LV function, moderate LA enlargement. Lab work is remarkable for TSH of 5.356, normal FT4.   The patient presented for atrial lead revision.  The procedure was successfully. There were no complications.  CXR was negative for pneumothorax.  The patient was seen by Dr. Ladona Ridgelaylor who felt he was stable for DC home.       Consults: None  Significant Diagnostic Studies:  CHEST 2 VIEW  COMPARISON: Chest x-ray of 07/25/2014  FINDINGS: A permanent pacemaker remains. The atrial lead now appears to lie overlie the region of the right atrium, with the ventricular lead overlying the apex of the right ventricle. The lungs are clear. The calcified granuloma in the right upper lobe is unchanged. No pneumothorax is seen. Mild cardiomegaly is stable. The previous pleural effusions have diminished.  IMPRESSION: The atrial lead now appears to overlie the right atrium. No  active lung disease. No pneumothorax.  EP Procedure Note  Procedure performed: Atrial lead revision  Preoperative diagnosis: Status post pacemaker insertion with atrial lead dislodgment  Postoperative diagnosis: Same as preoperative diagnosis  Description of the procedure: After consent was obtained, the patient was taken to the diagnostic EP lab in the fasting state. After the usual preparation draping, intravenous Versed and fentanyl was used for sedation. 30 cc of lidocaine was infiltrated into the left infraclavicular region. A 5 cm incision was carried out over the old incision. Electrocautery was utilized to dissect down to the fascial plane. The pacemaker generator was removed. The atrial lead was disconnected from the new [employer] pacemaker generator. The atrial lead was freed up. The atrial lead was withdrawn from the coronary sinus lead had lodged and back to the right atrium. The lead was actively fixed. P waves measured between 1.5 and 2 mV. The pacing threshold was 1 V at 0.5 ms. The impedance was 500 ohms. 10 V pacing did not stimulate the diaphragm. The lead was secured to the fascia with silk suture. The sewing sleeve was secured with silk suture. The atrial and ventricular leads were reconnected to the pacemaker generator and placed back in the subcutaneous pocket. The pocket was irrigated with antibiotic irrigation. The incision was closed with 2 layers of vital suture. Benzoin and Steri-Strips her pain on the skin. The patient was returned to the recovery area in satisfactory condition.  Complications: There were no immediate procedure complications  Conclusion: Successful atrial lead revision in a patient with symptomatic sinus node dysfunction status post pacemaker with atrial lead dislodgment previously.  Matthew [name],  M.D.  Treatments: See above  Discharge Exam: Blood pressure 165/76, pulse 60, temperature 97.9 F (36.6 C), temperature source Oral, resp. rate 20, height   (1.803 m), weight 178 lb 3.2 oz (80.831 kg), SpO2 100 %.   Disposition: 01-Home or Self Care      Discharge Instructions    Call MD for:  redness, tenderness, or signs of infection (pain, swelling, redness, odor or green/yellow discharge around incision site)    Complete by:  As directed      Diet - low sodium heart healthy    Complete by:  As directed      Diet - low sodium heart healthy    Complete by:  As directed      Increase activity slowly    Complete by:  As directed      Increase activity slowly    Complete by:  As directed             Medication List    TAKE these medications        acetaminophen 325 MG tablet  Commonly known as:  TYLENOL  Take 325 mg by mouth daily as needed (pain).     fluticasone 50 MCG/ACT nasal spray  Commonly known as:  FLONASE  Place 1-2 sprays into both nostrils at bedtime as needed (congestion).     furosemide 40 MG tablet  Commonly known as:  LASIX  Take 1 tablet (40 mg total) by mouth daily.     GLUCOSAMINE CHONDROITIN VIT D3 Caps  Take 1 capsule by mouth every other day. 1500 mg glucosamine, 1200 mg chondroitin, 1000 units vitamin D     Lutein 6 MG Tabs  Take 6 mg by mouth daily.     rivaroxaban 20 MG Tabs tablet  Commonly known as:  XARELTO  Take 1 tablet (20 mg total) by mouth daily.     sildenafil 100 MG tablet  Commonly known as:  VIAGRA  Take 100 mg by mouth daily as needed for erectile dysfunction.       Follow-up Information    Follow up with Donato Schultz, MD On 09/14/2014.   Specialty:  Cardiology   Why:  at 10AM   Contact information:   [address] [place] [address] [phone]       Follow up with Hillis Range, MD On 10/29/2014.   Specialty:  Cardiology   Why:  at [facility] information:   [address] [place] [address] [phone]       Follow up with Wound Check On 08/02/2014.   Why:  2:00 PM   Contact information:   [address] [place] [address] [phone]     Greater than 30 minutes was spent completing the patient's discharge.    SignedWilburt [name], PAC 07/26/2014, 9:19 AM  EP Attending  Patient seen and examined. Agree with findings as noted above. He is s/p atrial lead revision with normal atrial lead function. Ok to discharge with usual followup.   Leonia Reeves.D.

## 2014-07-26 NOTE — Progress Notes (Addendum)
Spoke to Lowe's CompaniesBryan Hager, PA he stated to leave pacer dressing in place at discharge and to instruct patient to remove tonight dressing after he showers then no showers for 1 week.  He also stated to have patient resume xarelto this evening.  Discharge paperwork review and given to patient and wife.

## 2014-07-27 MED FILL — Sodium Chloride Irrigation Soln 0.9%: Qty: 500 | Status: AC

## 2014-07-27 MED FILL — Gentamicin Sulfate Inj 40 MG/ML: INTRAMUSCULAR | Qty: 2 | Status: AC

## 2014-07-27 MED FILL — Cefazolin Sodium for IV Soln 2 GM and Dextrose 3% (50 ML): INTRAVENOUS | Qty: 50 | Status: AC

## 2014-08-02 ENCOUNTER — Ambulatory Visit: Payer: Medicare Other

## 2014-08-08 ENCOUNTER — Encounter: Payer: Self-pay | Admitting: Internal Medicine

## 2014-08-08 ENCOUNTER — Ambulatory Visit (INDEPENDENT_AMBULATORY_CARE_PROVIDER_SITE_OTHER): Payer: Medicare Other | Admitting: *Deleted

## 2014-08-08 DIAGNOSIS — Z950 Presence of cardiac pacemaker: Secondary | ICD-10-CM

## 2014-08-08 DIAGNOSIS — I495 Sick sinus syndrome: Secondary | ICD-10-CM

## 2014-08-08 LAB — CUP PACEART INCLINIC DEVICE CHECK
Battery Remaining Longevity: 123.6 mo
Battery Voltage: 3.02 V
Brady Statistic RV Percent Paced: 69 %
Lead Channel Impedance Value: 537.5 Ohm
Lead Channel Pacing Threshold Amplitude: 0.5 V
Lead Channel Pacing Threshold Amplitude: 0.75 V
Lead Channel Pacing Threshold Pulse Width: 0.5 ms
Lead Channel Sensing Intrinsic Amplitude: 12 mV
Lead Channel Setting Pacing Amplitude: 1 V
Lead Channel Setting Pacing Amplitude: 1.5 V
Lead Channel Setting Pacing Pulse Width: 0.4 ms
Lead Channel Setting Sensing Sensitivity: 2 mV
MDC IDC MSMT LEADCHNL RA SENSING INTR AMPL: 1.7 mV
MDC IDC MSMT LEADCHNL RV IMPEDANCE VALUE: 662.5 Ohm
MDC IDC MSMT LEADCHNL RV PACING THRESHOLD PULSEWIDTH: 0.4 ms
MDC IDC PG SERIAL: 7779900
MDC IDC SESS DTM: 20160720123221
MDC IDC STAT BRADY RA PERCENT PACED: 99 %

## 2014-08-08 NOTE — Patient Instructions (Signed)
Per Dr Ladona Ridgelaylor stop Xarelto x 1 week and recheck wound on 08-17-14 @ 1030 with device clinic.

## 2014-08-17 ENCOUNTER — Ambulatory Visit (INDEPENDENT_AMBULATORY_CARE_PROVIDER_SITE_OTHER): Payer: Medicare Other | Admitting: *Deleted

## 2014-08-17 DIAGNOSIS — I480 Paroxysmal atrial fibrillation: Secondary | ICD-10-CM

## 2014-08-17 NOTE — Patient Instructions (Signed)
Your physician has recommended you make the following change in your medication: RESUME YOUR XARELTO 20 MG DAILY ON 08/22/2014.   Call the office if you notice an increase in the size of your device area.  Plan to follow up with Dr.Allred as scheduled.

## 2014-08-17 NOTE — Progress Notes (Signed)
Pacemaker wound recheck for hematoma. Hematoma is dissipating. Ecchymosis is changing colors appropriately--currently yellow above device area. Dr.Klein assessed hematoma. Patient to resume Xarelto on 08/22/2014. Plan to follow up with Dr.Allred as scheduled.

## 2014-08-27 NOTE — Progress Notes (Signed)
Wound check appointment. Steri-strips removed. GT evaluated wound due to swelling and full pocket. per GT--stop Xarelto x 1 week and wound recheck on 08-17-14. Normal device function. Thresholds, sensing, and impedances consistent with implant measurements. Device programmed at 3.5V/auto capture programmed on for extra safety margin until 3 month visit. Histogram distribution appropriate for patient and level of activity. No mode switches or high ventricular rates noted. Patient educated about wound care, arm mobility, lifting restrictions. ROV in 3 months with JA.

## 2014-09-14 ENCOUNTER — Encounter: Payer: Self-pay | Admitting: Cardiology

## 2014-09-14 ENCOUNTER — Ambulatory Visit (INDEPENDENT_AMBULATORY_CARE_PROVIDER_SITE_OTHER): Payer: Medicare Other | Admitting: Cardiology

## 2014-09-14 VITALS — BP 122/68 | HR 61 | Ht 70.0 in | Wt 186.5 lb

## 2014-09-14 DIAGNOSIS — I442 Atrioventricular block, complete: Secondary | ICD-10-CM

## 2014-09-14 DIAGNOSIS — R001 Bradycardia, unspecified: Secondary | ICD-10-CM | POA: Diagnosis not present

## 2014-09-14 DIAGNOSIS — I484 Atypical atrial flutter: Secondary | ICD-10-CM | POA: Diagnosis not present

## 2014-09-14 DIAGNOSIS — I4892 Unspecified atrial flutter: Secondary | ICD-10-CM

## 2014-09-14 DIAGNOSIS — T82120S Displacement of cardiac electrode, sequela: Secondary | ICD-10-CM

## 2014-09-14 NOTE — Patient Instructions (Signed)
Continue taking current medications  Dr Anne Fu would like to followup in six months.  We will send a reminder to your home when it is time to schedule your appointment.

## 2014-09-14 NOTE — Progress Notes (Signed)
[address] [place], [place]  [address] Phone: [phone] Fax:  [phone]  Date:  09/14/2014   ID:  Matthew [name], DOB 05/31/1931, MRN [id number]  PCP:  Ginette Otto, MD   History of Present Illness: Matthew [name] is a 81 y.o. male with, sick sinus syndrome, paroxysmal atrial fibrillation/flutter on chronic anticoagulation with urgent pacemaker placement previously for symptomatic bradycardia, complete heart block/AV block. heart rate was in the 30s. No syncope. No chest pain.  Interestingly, on 09/15/2013, EKG showed atrial flutter with 4-1 conduction. Asymptomatic. Previously with his atrial fibrillation he felt poorly especially in hot weather.  His grandson was short stop for [employer] baseball now [place], recent transfer to [employer] who won world series, Bebe Shaggy.  Feels much better since placement.   Wt Readings from Last 3 Encounters:  09/14/14 186 lb 8 oz (84.596 kg)  07/26/14 178 lb 3.2 oz (80.831 kg)  07/20/14 189 lb 9.6 oz (86.002 kg)     Past Medical History  Diagnosis Date  . Rosacea   . Macular degeneration   . Gilbert's syndrome   . Thrombocytopenia     Mild per Dr. Arbutus Ped  . Bradycardia   . Venous insufficiency   . Atrial fibrillation   . Sick sinus syndrome   . Presence of permanent cardiac pacemaker   . Family history of adverse reaction to anesthesia     "mother had carotid OR; they put her too deep; heart stopped in middle of operation; had to do CPR; broke a bunch of ribs; brother went too deep also; they had hard time gettin him out of it"  . Dysrhythmia     Past Surgical History  Procedure Laterality Date  . Knee arthroscopy Right   . Tonsillectomy    . Vasectomy    . Endovenous ablation saphenous vein w/ laser Left 06-22-11    greater saphenous vein   . Cardioversion  08/07/2011    Procedure: CARDIOVERSION;  Surgeon: Donato Schultz, MD;  Location: [facility] OR;  Service: Cardiovascular;  Laterality: N/A;  .  Insert / replace / remove pacemaker  07/24/2014  . Ep implantable device N/A 07/24/2014    Procedure: Pacemaker Implant;  Surgeon: Hillis Range, MD;  Location: [facility] INVASIVE CV LAB;  Service: Cardiovascular;  Laterality: N/A;  . Ep implantable device N/A 07/25/2014    Procedure: Lead Revision;  Surgeon: Marinus Maw, MD;  Location: [facility] INVASIVE CV LAB;  Service: Cardiovascular;  Laterality: N/A;    Current Outpatient Prescriptions  Medication Sig Dispense Refill  . acetaminophen (TYLENOL) 325 MG tablet Take 325 mg by mouth daily as needed (pain).    . fluticasone (FLONASE) 50 MCG/ACT nasal spray Place 1-2 sprays into both nostrils at bedtime as needed (congestion).     . furosemide (LASIX) 40 MG tablet Take 40 mg by mouth as needed.    . Lutein 6 MG TABS Take 6 mg by mouth daily.     . Misc Natural Products (GLUCOSAMINE CHONDROITIN VIT D3) CAPS Take 1 capsule by mouth every other day. 1500 mg glucosamine, 1200 mg chondroitin, 1000 units vitamin D    . rivaroxaban (XARELTO) 20 MG TABS tablet Take 1 tablet (20 mg total) by mouth daily. 30 tablet 6  . sildenafil (VIAGRA) 100 MG tablet Take 100 mg by mouth daily as needed for erectile dysfunction.      No current facility-administered medications for this visit.    Allergies:  Allergies  Allergen Reactions  . Adhesive [Tape] Other (See Comments)    Makes skin raw - please use paper tape  . Codeine Other (See Comments)    "makes him crazy"    Social History:  The patient  reports that he has never smoked. He has never used smokeless tobacco. He reports that he drinks alcohol. He reports that he does not use illicit drugs.   ROS:  Please see the history of present illness.  Denies any fevers, chills, orthopnea, PND, syncope, chest pain, shortness of breath  PHYSICAL EXAM: VS:  BP 122/68 mmHg  Pulse 61  Ht 5\' 10"  (1.778 m)  Wt 186 lb 8 oz (84.596 kg)  BMI 26.76 kg/m2 Well nourished, well developed, in no acute distress HEENT:  normal Neck: no JVD Cardiac: RRR, pacer site looks clean, dry, intact Lungs:  clear to auscultation bilaterally, no wheezing, rhonchi or rales Abd: soft, nontender, no hepatomegaly Ext: no edema Skin: warm and dry Neuro: no focal abnormalities noted  EKG: Previous 07/20/14-heart rate 44 bpm, sinus bradycardia severe with junctional escape, complete heart block, narrow complex QRS. Previously 09/15/13-atrial flutter, 4-1 conduction, left axis deviation  ECHO 2013 - EF 65%, moderately diltated LA. 46mm  Labs: 02/02/13-creatinine 1.1, hemoglobin 14.6  ASSESSMENT AND PLAN:  1. Complete heart block-pacemaker placed. Needed lead revision on day following pacemaker original implantation. Overall doing well. Xarelto 2. Atrial fibrillation/flutter-Xarelto, previously in atrial flutter with 4:1 conduction. I informed him that he will continue with his anticoagulation. Explained why having a pacemaker does not negate atrial flutter or fibrillation possibility. He asked questions about potential hackers to remote device. I discussed with our device team. 3. Sick sinus syndrome- pacemaker. 4. Chronic anticoagulation-continue Xarelto as above. No bleeding.  Donato Schultz, MD [facility]  09/14/2014 10:19 AM

## 2014-10-29 ENCOUNTER — Encounter: Payer: Medicare Other | Admitting: Internal Medicine

## 2014-10-29 ENCOUNTER — Ambulatory Visit (INDEPENDENT_AMBULATORY_CARE_PROVIDER_SITE_OTHER): Payer: Medicare Other | Admitting: Internal Medicine

## 2014-10-29 ENCOUNTER — Encounter: Payer: Self-pay | Admitting: Internal Medicine

## 2014-10-29 VITALS — BP 110/72 | HR 60 | Ht 71.0 in | Wt 186.0 lb

## 2014-10-29 DIAGNOSIS — I441 Atrioventricular block, second degree: Secondary | ICD-10-CM | POA: Diagnosis not present

## 2014-10-29 DIAGNOSIS — I495 Sick sinus syndrome: Secondary | ICD-10-CM

## 2014-10-29 DIAGNOSIS — Z45018 Encounter for adjustment and management of other part of cardiac pacemaker: Secondary | ICD-10-CM

## 2014-10-29 DIAGNOSIS — R001 Bradycardia, unspecified: Secondary | ICD-10-CM

## 2014-10-29 DIAGNOSIS — I4892 Unspecified atrial flutter: Secondary | ICD-10-CM | POA: Diagnosis not present

## 2014-10-29 DIAGNOSIS — I480 Paroxysmal atrial fibrillation: Secondary | ICD-10-CM | POA: Diagnosis not present

## 2014-10-29 LAB — CUP PACEART INCLINIC DEVICE CHECK
Brady Statistic RA Percent Paced: 97 %
Brady Statistic RV Percent Paced: 83 %
Date Time Interrogation Session: 20161010143429
Lead Channel Pacing Threshold Amplitude: 0.625 V
Lead Channel Pacing Threshold Amplitude: 0.625 V
Lead Channel Pacing Threshold Pulse Width: 0.5 ms
Lead Channel Sensing Intrinsic Amplitude: 1.2 mV
Lead Channel Sensing Intrinsic Amplitude: 12 mV
Lead Channel Setting Pacing Amplitude: 0.875
Lead Channel Setting Sensing Sensitivity: 2 mV
MDC IDC MSMT BATTERY REMAINING LONGEVITY: 116.4 mo
MDC IDC MSMT BATTERY VOLTAGE: 3.01 V
MDC IDC MSMT LEADCHNL RV PACING THRESHOLD PULSEWIDTH: 0.4 ms
MDC IDC SET LEADCHNL RA PACING AMPLITUDE: 1.625
MDC IDC SET LEADCHNL RV PACING PULSEWIDTH: 0.4 ms
Pulse Gen Model: 2240
Pulse Gen Serial Number: 7779900

## 2014-10-29 NOTE — Patient Instructions (Addendum)
Medication Instructions:  Your physician recommends that you continue on your current medications as directed. Please refer to the Current Medication list given to you today.  Labwork: None ordered  Testing/Procedures: None ordered  Follow-Up: Remote monitoring is used to monitor your Pacemaker of ICD from home. This monitoring reduces the number of office visits required to check your device to one time per year. It allows [place] to keep an eye on the functioning of your device to ensure it is working properly. You are scheduled for a device check from home on 01/28/2015. You may send your transmission at any time that day. If you have a wireless device, the transmission will be sent automatically. After your physician reviews your transmission, you will receive a postcard with your next transmission date.  Your physician wants you to follow-up in: 12 months with Gypsy Balsam, NP.  You will receive a reminder letter in the mail two months in advance. If you don't receive a letter, please call our office to schedule the follow-up appointment.  Any Other Special Instructions Will Be Listed Below (If Applicable). Thank you for choosing [facility]!!

## 2014-10-29 NOTE — Progress Notes (Signed)
Electrophysiology Office Note   Date:  10/29/2014   ID:  Matthew [name], DOB May 04, 1931, MRN [id number]  PCP:  Ginette Otto, MD  Cardiologist:  Dr Anne Fu Primary Electrophysiologist: Hillis Range, MD    Chief Complaint  Patient presents with  . Bradycardia  . Atrial Fibrillation  . SSS     History of Present Illness: Matthew [name] is a 81 y.o. male who presents today for electrophysiology evaluation.   Doing well s/p PPM implantation.  Energy is good.  Denies procedure related complications.  His grandson is playing baseball this spring for [place].  Today, he denies symptoms of palpitations, chest pain, shortness of breath, orthopnea, PND, lower extremity edema, claudication, dizziness, presyncope, syncope, bleeding, or neurologic sequela. The patient is tolerating medications without difficulties and is otherwise without complaint today.    Past Medical History  Diagnosis Date  . Rosacea   . Macular degeneration   . Gilbert's syndrome   . Thrombocytopenia     Mild per Dr. Arbutus Ped  . Bradycardia   . Venous insufficiency   . Atrial fibrillation   . Sick sinus syndrome   . Presence of permanent cardiac pacemaker   . Family history of adverse reaction to anesthesia     "mother had carotid OR; they put her too deep; heart stopped in middle of operation; had to do CPR; broke a bunch of ribs; brother went too deep also; they had hard time gettin him out of it"  . Dysrhythmia    Past Surgical History  Procedure Laterality Date  . Knee arthroscopy Right   . Tonsillectomy    . Vasectomy    . Endovenous ablation saphenous vein w/ laser Left 06-22-11    greater saphenous vein   . Cardioversion  08/07/2011    Procedure: CARDIOVERSION;  Surgeon: Donato Schultz, MD;  Location: [facility] OR;  Service: Cardiovascular;  Laterality: N/A;  . Insert / replace / remove pacemaker  07/24/2014  . Ep implantable device N/A 07/24/2014    Procedure: Pacemaker Implant;  Surgeon: Hillis Range,  MD;  Location: [facility] INVASIVE CV LAB;  Service: Cardiovascular;  Laterality: N/A;  . Ep implantable device N/A 07/25/2014    Procedure: Lead Revision;  Surgeon: Marinus Maw, MD;  Location: [facility] INVASIVE CV LAB;  Service: Cardiovascular;  Laterality: N/A;     Current Outpatient Prescriptions  Medication Sig Dispense Refill  . acetaminophen (TYLENOL) 325 MG tablet Take 325 mg by mouth daily as needed (pain).    . fluticasone (FLONASE) 50 MCG/ACT nasal spray Place 1-2 sprays into both nostrils at bedtime as needed (congestion).     . furosemide (LASIX) 40 MG tablet Take 40 mg by mouth as needed for fluid.     . Lutein 6 MG TABS Take 6 mg by mouth daily as needed (eyes).     . Misc Natural Products (GLUCOSAMINE CHONDROITIN VIT D3) CAPS Take 1 capsule by mouth every other day. 1500 mg glucosamine, 1200 mg chondroitin, 1000 units vitamin D    . rivaroxaban (XARELTO) 20 MG TABS tablet Take 1 tablet (20 mg total) by mouth daily. 30 tablet 6  . sildenafil (VIAGRA) 100 MG tablet Take 100 mg by mouth daily as needed for erectile dysfunction.      No current facility-administered medications for this visit.    Allergies:   Adhesive and Codeine   Social History:  The patient  reports that he has never smoked. He has never used smokeless tobacco. He reports that he  drinks alcohol. He reports that he does not use illicit drugs.   Family History:  The patient's family history includes Cancer in his brother and mother; Diabetes in his brother; Heart attack in his brother; Heart disease in his brother; Hypertension in his brother. There is no history of Stroke.    ROS:  Please see the history of present illness.   All other systems are reviewed and negative.    PHYSICAL EXAM: VS:  BP 110/72 mmHg  Pulse 60  Ht  (1.803 m)  Wt 186 lb (84.369 kg)  BMI 25.95 kg/m2 , BMI Body mass index is 25.95 kg/(m^2). GEN: Well nourished, well developed, in no acute distress HEENT: normal Neck: no JVD, carotid  bruits, or masses Cardiac: RRR; no murmurs, rubs, or gallops,no edema  Respiratory:  clear to auscultation bilaterally, normal work of breathing GI: soft, nontender, nondistended, + BS MS: no deformity or atrophy Skin: warm and dry, device pocket is well healed Neuro:  Strength and sensation are intact Psych: euthymic mood, full affect  EKG:  EKG is ordered today. The ekg ordered today shows av paced with pseudofusion at times  Device interrogation is reviewed today in detail.  See PaceArt for details.   Recent Labs: 07/20/2014: Hemoglobin 12.6*; Platelets 97*; TSH 5.356* 07/25/2014: BUN 17; Creatinine, Ser 1.10; Potassium 4.0; Sodium 139    Lipid Panel  No results found for: CHOL, TRIG, HDL, CHOLHDL, VLDL, LDLCALC, LDLDIRECT   Wt Readings from Last 3 Encounters:  10/29/14 186 lb (84.369 kg)  09/14/14 186 lb 8 oz (84.596 kg)  07/26/14 178 lb 3.2 oz (80.831 kg)    ASSESSMENT AND PLAN:  1.  Sick sinus syndrome Doing well V paced 83% with VIP.  Will therefore adjusted PAV/SAV to 200/180  2. Atrial fibrillation On xarelto No afib by PPM interrogation today Will follow remotely   Follow-up: merlin Return to see EP NP in 1 year Follow-up with Dr Anne Fu as scheduled  Current medicines are reviewed at length with the patient today.   The patient does not have concerns regarding his medicines.  The following changes were made today:  none  Labs/ tests ordered today include:  Orders Placed This Encounter  Procedures  . EKG 12-Lead     Signed, Hillis Range, MD  10/29/2014 10:51 AM     [facility] [address] [place] [address] [phone] (office) [phone] (fax)

## 2014-12-04 ENCOUNTER — Other Ambulatory Visit: Payer: Self-pay | Admitting: Cardiology

## 2015-01-28 ENCOUNTER — Telehealth: Payer: Self-pay | Admitting: Internal Medicine

## 2015-01-28 ENCOUNTER — Ambulatory Visit (INDEPENDENT_AMBULATORY_CARE_PROVIDER_SITE_OTHER): Payer: Medicare Other | Admitting: *Deleted

## 2015-01-28 ENCOUNTER — Telehealth: Payer: Self-pay | Admitting: Cardiology

## 2015-01-28 DIAGNOSIS — I495 Sick sinus syndrome: Secondary | ICD-10-CM | POA: Diagnosis not present

## 2015-01-28 NOTE — Telephone Encounter (Signed)
New message     Patient calling back to speak someone in device clinic

## 2015-01-28 NOTE — Progress Notes (Signed)
Remote pacemaker transmission.   

## 2015-01-28 NOTE — Telephone Encounter (Signed)
LMOVM reminding pt to send remote transmission.   

## 2015-01-28 NOTE — Telephone Encounter (Signed)
Spoke w/ pt and instructed him how to send a remote transmission.

## 2015-02-17 LAB — CUP PACEART REMOTE DEVICE CHECK
Battery Remaining Percentage: 95.5 %
Battery Voltage: 2.99 V
Brady Statistic AP VP Percent: 96 %
Brady Statistic AP VS Percent: 1 %
Brady Statistic AS VP Percent: 1.8 %
Brady Statistic AS VS Percent: 1.1 %
Brady Statistic RA Percent Paced: 96 %
Brady Statistic RV Percent Paced: 98 %
Date Time Interrogation Session: 20170109195012
Implantable Lead Implant Date: 20160705
Implantable Lead Location: 753859
Implantable Lead Model: 1948
Lead Channel Impedance Value: 430 Ohm
Lead Channel Impedance Value: 630 Ohm
Lead Channel Pacing Threshold Amplitude: 0.625 V
Lead Channel Pacing Threshold Pulse Width: 0.4 ms
Lead Channel Pacing Threshold Pulse Width: 0.5 ms
Lead Channel Sensing Intrinsic Amplitude: 12 mV
Lead Channel Setting Pacing Amplitude: 1.625
MDC IDC LEAD IMPLANT DT: 20160705
MDC IDC LEAD LOCATION: 753860
MDC IDC MSMT BATTERY REMAINING LONGEVITY: 119 mo
MDC IDC MSMT LEADCHNL RA SENSING INTR AMPL: 1.9 mV
MDC IDC MSMT LEADCHNL RV PACING THRESHOLD AMPLITUDE: 0.625 V
MDC IDC SET LEADCHNL RV PACING AMPLITUDE: 0.875
MDC IDC SET LEADCHNL RV PACING PULSEWIDTH: 0.4 ms
MDC IDC SET LEADCHNL RV SENSING SENSITIVITY: 2 mV
Pulse Gen Model: 2240
Pulse Gen Serial Number: 7779900

## 2015-02-20 ENCOUNTER — Encounter: Payer: Self-pay | Admitting: Cardiology

## 2015-03-07 ENCOUNTER — Encounter: Payer: Self-pay | Admitting: Cardiology

## 2015-03-07 ENCOUNTER — Ambulatory Visit (INDEPENDENT_AMBULATORY_CARE_PROVIDER_SITE_OTHER): Payer: Medicare Other | Admitting: Cardiology

## 2015-03-07 VITALS — BP 128/82 | HR 82 | Ht 71.0 in | Wt 194.8 lb

## 2015-03-07 DIAGNOSIS — Z7901 Long term (current) use of anticoagulants: Secondary | ICD-10-CM

## 2015-03-07 DIAGNOSIS — I442 Atrioventricular block, complete: Secondary | ICD-10-CM

## 2015-03-07 DIAGNOSIS — Z950 Presence of cardiac pacemaker: Secondary | ICD-10-CM | POA: Diagnosis not present

## 2015-03-07 DIAGNOSIS — I4892 Unspecified atrial flutter: Secondary | ICD-10-CM

## 2015-03-07 DIAGNOSIS — I480 Paroxysmal atrial fibrillation: Secondary | ICD-10-CM

## 2015-03-07 MED ORDER — RIVAROXABAN 20 MG PO TABS: 20.0000 mg | ORAL_TABLET | Freq: Every day | ORAL | Status: DC

## 2015-03-07 NOTE — Progress Notes (Signed)
[address], [address] [place], [place]  [address] Phone: [phone] Fax:  [phone]  Date:  03/07/2015   ID:  Matthew [name], DOB [birth year], MRN [id number]  PCP:  Ginette Otto, MD   History of Present Illness: Matthew [name] is a 82 y.o. male with, sick sinus syndrome, paroxysmal atrial fibrillation/flutter on chronic anticoagulation with urgent pacemaker placement previously for symptomatic bradycardia, complete heart block/AV block. heart rate was in the 30s. No syncope. No chest pain.  Interestingly, on 09/15/2013, EKG showed atrial flutter with 4-1 conduction. Asymptomatic. Previously with his atrial fibrillation he felt poorly especially in hot weather.  His grandson was short stop for [employer] baseball now [place], recent transfer to [employer] who won world series, [employer]. Granddaughter, [employer], Clinical research associate, possible scholarship.   Feels much better since pacer placement.  V-Paced 83% of time.    Wt Readings from Last 3 Encounters:  03/07/15 194 lb 12.8 oz (88.361 kg)  10/29/14 186 lb (84.369 kg)  09/14/14 186 lb 8 oz (84.596 kg)     Past Medical History  Diagnosis Date  . Rosacea   . Macular degeneration   . Gilbert's syndrome   . Thrombocytopenia (HCC)     Mild per Dr. Arbutus Ped  . Bradycardia   . Venous insufficiency   . Atrial fibrillation (HCC)   . Sick sinus syndrome (HCC)   . Presence of permanent cardiac pacemaker   . Family history of adverse reaction to anesthesia     "mother had carotid OR; they put her too deep; heart stopped in middle of operation; had to do CPR; broke a bunch of ribs; brother went too deep also; they had hard time gettin him out of it"  . Dysrhythmia     Past Surgical History  Procedure Laterality Date  . Knee arthroscopy Right   . Tonsillectomy    . Vasectomy    . Endovenous ablation saphenous vein w/ laser Left 06-22-11    greater saphenous vein   . Cardioversion  08/07/2011    Procedure: CARDIOVERSION;   Surgeon: Donato Schultz, MD;  Location: [facility] OR;  Service: Cardiovascular;  Laterality: N/A;  . Insert / replace / remove pacemaker  07/24/2014  . Ep implantable device N/A 07/24/2014    Procedure: Pacemaker Implant;  Surgeon: Hillis Range, MD;  Location: [facility] INVASIVE CV LAB;  Service: Cardiovascular;  Laterality: N/A;  . Ep implantable device N/A 07/25/2014    Procedure: Lead Revision;  Surgeon: Marinus Maw, MD;  Location: [facility] INVASIVE CV LAB;  Service: Cardiovascular;  Laterality: N/A;    Current Outpatient Prescriptions  Medication Sig Dispense Refill  . acetaminophen (TYLENOL) 325 MG tablet Take 325 mg by mouth daily as needed (pain).    . fluticasone (FLONASE) 50 MCG/ACT nasal spray Place 1-2 sprays into both nostrils at bedtime as needed (congestion).     . furosemide (LASIX) 40 MG tablet Take 40 mg by mouth as needed for fluid.     . Lutein 6 MG TABS Take 6 mg by mouth daily as needed (eyes).     . Misc Natural Products (GLUCOSAMINE CHONDROITIN VIT D3) CAPS Take 1 capsule by mouth every other day. 1500 mg glucosamine, 1200 mg chondroitin, 1000 units vitamin D    . sildenafil (VIAGRA) 100 MG tablet Take 100 mg by mouth daily as needed for erectile dysfunction.     Carlena Hurl 20 MG TABS tablet TAKE ONE TABLET BY MOUTH ONCE DAILY 30  tablet 5   No current facility-administered medications for this visit.    Allergies:    Allergies  Allergen Reactions  . Adhesive [Tape] Other (See Comments)    Makes skin raw - please use paper tape  . Codeine Other (See Comments)    "makes him crazy"    Social History:  The patient  reports that he has never smoked. He has never used smokeless tobacco. He reports that he drinks alcohol. He reports that he does not use illicit drugs.   ROS:  Please see the history of present illness.  Denies any fevers, chills, orthopnea, PND, syncope, chest pain, shortness of breath  PHYSICAL EXAM: VS:  BP 128/82 mmHg  Pulse 82  Ht  (1.803 m)  Wt 194 lb 12.8 oz  (88.361 kg)  BMI 27.18 kg/m2 Well nourished, well developed, in no acute distress HEENT: normal Neck: no JVD Cardiac: RRR, pacer site looks clean, dry, intact Lungs:  clear to auscultation bilaterally, no wheezing, rhonchi or rales Abd: soft, nontender, no hepatomegaly Ext: no edema Skin: warm and dry Neuro: no focal abnormalities noted  EKG: Previous 07/20/14-heart rate 44 bpm, sinus bradycardia severe with junctional escape, complete heart block, narrow complex QRS. Previously 09/15/13-atrial flutter, 4-1 conduction, left axis deviation  ECHO 2013 - EF 65%, moderately diltated LA. 46mm  Labs: 02/02/13-creatinine 1.1, hemoglobin 14.6  ASSESSMENT AND PLAN:  1. Complete heart block-pacemaker placed. Needed lead revision on day following pacemaker original implantation. Overall doing well. Xarelto 2. Atrial fibrillation/flutter-Xarelto, previously in atrial flutter with 4:1 conduction. I informed him that he will continue with his anticoagulation.  He asked questions about potential hackers to remote device. I discussed with our device team.  3. Sick sinus syndrome- pacemaker. 4. Chronic anticoagulation-continue Xarelto as above. No bleeding. 5. [employer] - he will be talking with MD about this.  6. 6 month follow up  Donato Schultz, MD [facility]  03/07/2015 11:28 AM

## 2015-03-07 NOTE — Patient Instructions (Signed)

## 2015-04-29 ENCOUNTER — Telehealth: Payer: Self-pay | Admitting: Cardiology

## 2015-04-29 ENCOUNTER — Ambulatory Visit (INDEPENDENT_AMBULATORY_CARE_PROVIDER_SITE_OTHER): Payer: Medicare Other | Admitting: *Deleted

## 2015-04-29 DIAGNOSIS — I495 Sick sinus syndrome: Secondary | ICD-10-CM | POA: Diagnosis not present

## 2015-04-29 NOTE — Progress Notes (Signed)
Remote pacemaker transmission.   

## 2015-04-29 NOTE — Telephone Encounter (Signed)
Spoke with pt and reminded pt of remote transmission that is due today. Pt verbalized understanding.   

## 2015-05-15 DIAGNOSIS — I480 Paroxysmal atrial fibrillation: Secondary | ICD-10-CM | POA: Diagnosis not present

## 2015-05-15 DIAGNOSIS — L989 Disorder of the skin and subcutaneous tissue, unspecified: Secondary | ICD-10-CM | POA: Diagnosis not present

## 2015-05-15 DIAGNOSIS — D696 Thrombocytopenia, unspecified: Secondary | ICD-10-CM | POA: Diagnosis not present

## 2015-05-15 DIAGNOSIS — Z1389 Encounter for screening for other disorder: Secondary | ICD-10-CM | POA: Diagnosis not present

## 2015-05-15 DIAGNOSIS — Z0000 Encounter for general adult medical examination without abnormal findings: Secondary | ICD-10-CM | POA: Diagnosis not present

## 2015-05-15 DIAGNOSIS — Z79899 Other long term (current) drug therapy: Secondary | ICD-10-CM | POA: Diagnosis not present

## 2015-05-22 DIAGNOSIS — L821 Other seborrheic keratosis: Secondary | ICD-10-CM | POA: Diagnosis not present

## 2015-05-22 DIAGNOSIS — L570 Actinic keratosis: Secondary | ICD-10-CM | POA: Diagnosis not present

## 2015-05-22 DIAGNOSIS — D1801 Hemangioma of skin and subcutaneous tissue: Secondary | ICD-10-CM | POA: Diagnosis not present

## 2015-05-22 DIAGNOSIS — D692 Other nonthrombocytopenic purpura: Secondary | ICD-10-CM | POA: Diagnosis not present

## 2015-06-03 LAB — CUP PACEART REMOTE DEVICE CHECK
Battery Remaining Longevity: 118 mo
Battery Remaining Percentage: 95.5 %
Brady Statistic AP VP Percent: 95 %
Brady Statistic AP VS Percent: 1 %
Brady Statistic AS VP Percent: 3.4 %
Brady Statistic AS VS Percent: 1.1 %
Brady Statistic RA Percent Paced: 62 %
Brady Statistic RV Percent Paced: 98 %
Implantable Lead Implant Date: 20160705
Implantable Lead Implant Date: 20160705
Implantable Lead Location: 753860
Lead Channel Pacing Threshold Amplitude: 0.75 V
Lead Channel Pacing Threshold Pulse Width: 0.4 ms
Lead Channel Sensing Intrinsic Amplitude: 1 mV
Lead Channel Setting Pacing Amplitude: 1 V
Lead Channel Setting Pacing Pulse Width: 0.4 ms
MDC IDC LEAD LOCATION: 753859
MDC IDC LEAD MODEL: 1948
MDC IDC MSMT BATTERY VOLTAGE: 2.99 V
MDC IDC MSMT LEADCHNL RA IMPEDANCE VALUE: 410 Ohm
MDC IDC MSMT LEADCHNL RV IMPEDANCE VALUE: 650 Ohm
MDC IDC MSMT LEADCHNL RV SENSING INTR AMPL: 12 mV
MDC IDC SESS DTM: 20170410142049
MDC IDC SET LEADCHNL RA PACING AMPLITUDE: 1.5 V
MDC IDC SET LEADCHNL RV SENSING SENSITIVITY: 2 mV
Pulse Gen Model: 2240
Pulse Gen Serial Number: 7779900

## 2015-06-04 ENCOUNTER — Encounter: Payer: Self-pay | Admitting: Cardiology

## 2015-07-29 ENCOUNTER — Ambulatory Visit (INDEPENDENT_AMBULATORY_CARE_PROVIDER_SITE_OTHER): Payer: Medicare Other | Admitting: *Deleted

## 2015-07-29 DIAGNOSIS — I495 Sick sinus syndrome: Secondary | ICD-10-CM | POA: Diagnosis not present

## 2015-07-29 NOTE — Progress Notes (Signed)
Remote pacemaker transmission.   

## 2015-07-31 ENCOUNTER — Encounter: Payer: Self-pay | Admitting: Cardiology

## 2015-07-31 LAB — CUP PACEART REMOTE DEVICE CHECK
Battery Remaining Longevity: 119 mo
Battery Voltage: 2.99 V
Brady Statistic AP VP Percent: 93 %
Brady Statistic AP VS Percent: 1 %
Brady Statistic AS VP Percent: 4.7 %
Brady Statistic AS VS Percent: 1.1 %
Brady Statistic RA Percent Paced: 42 %
Brady Statistic RV Percent Paced: 99 %
Implantable Lead Implant Date: 20160705
Implantable Lead Implant Date: 20160705
Implantable Lead Location: 753860
Implantable Lead Model: 1948
Lead Channel Impedance Value: 660 Ohm
Lead Channel Pacing Threshold Amplitude: 0.625 V
Lead Channel Pacing Threshold Pulse Width: 0.4 ms
Lead Channel Sensing Intrinsic Amplitude: 1 mV
Lead Channel Sensing Intrinsic Amplitude: 12 mV
Lead Channel Setting Pacing Amplitude: 0.875
Lead Channel Setting Pacing Amplitude: 1.5 V
Lead Channel Setting Pacing Pulse Width: 0.4 ms
Lead Channel Setting Sensing Sensitivity: 2 mV
MDC IDC LEAD LOCATION: 753859
MDC IDC MSMT BATTERY REMAINING PERCENTAGE: 95.5 %
MDC IDC MSMT LEADCHNL RA IMPEDANCE VALUE: 430 Ohm
MDC IDC PG SERIAL: 7779900
MDC IDC SESS DTM: 20170710101510

## 2015-09-03 ENCOUNTER — Encounter: Payer: Self-pay | Admitting: Cardiology

## 2015-09-17 ENCOUNTER — Ambulatory Visit: Payer: Medicare Other | Admitting: Cardiology

## 2015-09-18 ENCOUNTER — Encounter: Payer: Self-pay | Admitting: Cardiology

## 2015-09-18 ENCOUNTER — Encounter (INDEPENDENT_AMBULATORY_CARE_PROVIDER_SITE_OTHER): Payer: Self-pay

## 2015-09-18 ENCOUNTER — Ambulatory Visit (INDEPENDENT_AMBULATORY_CARE_PROVIDER_SITE_OTHER): Payer: Medicare Other | Admitting: Cardiology

## 2015-09-18 VITALS — BP 110/72 | HR 62 | Ht 71.0 in | Wt 188.8 lb

## 2015-09-18 DIAGNOSIS — Z45018 Encounter for adjustment and management of other part of cardiac pacemaker: Secondary | ICD-10-CM | POA: Diagnosis not present

## 2015-09-18 DIAGNOSIS — R001 Bradycardia, unspecified: Secondary | ICD-10-CM

## 2015-09-18 DIAGNOSIS — Z7901 Long term (current) use of anticoagulants: Secondary | ICD-10-CM | POA: Diagnosis not present

## 2015-09-18 DIAGNOSIS — I480 Paroxysmal atrial fibrillation: Secondary | ICD-10-CM | POA: Diagnosis not present

## 2015-09-18 NOTE — Patient Instructions (Signed)

## 2015-09-18 NOTE — Progress Notes (Signed)
[address] [place], [place]  [address] Phone: [phone] Fax:  [phone]  Date:  09/18/2015   ID:  Matthew [name], DOB [birth year], MRN [id number]  PCP:  Ginette Otto, MD   History of Present Illness: Matthew [name] is a 82 y.o. male with, sick sinus syndrome, paroxysmal atrial fibrillation/flutter on chronic anticoagulation with urgent pacemaker placement previously for symptomatic bradycardia, complete heart block/AV block. heart rate was in the 30s.   Interestingly, on 09/15/2013, EKG showed atrial flutter with 4-1 conduction. Asymptomatic. Previously with his atrial fibrillation he felt poorly especially in hot weather.  His grandson was short stop for [employer] baseball, transfer to [place] who won world series, [employer]-now playing for the [employer] in [place]. Granddaughter, [employer], Clinical research associate, scholarship.    Feels much better since pacer placement.  V-Paced 83% of time. Doing well, no chest pain, no syncope.  Does not feel as pacemaker at all.   Wt Readings from Last 3 Encounters:  09/18/15 188 lb 12.8 oz (85.6 kg)  03/07/15 194 lb 12.8 oz (88.4 kg)  10/29/14 186 lb (84.4 kg)     Past Medical History:  Diagnosis Date  . Atrial fibrillation (HCC)   . Bradycardia   . Dysrhythmia   . Family history of adverse reaction to anesthesia    "mother had carotid OR; they put her too deep; heart stopped in middle of operation; had to do CPR; broke a bunch of ribs; brother went too deep also; they had hard time gettin him out of it"  . Gilbert's syndrome   . Macular degeneration   . Presence of permanent cardiac pacemaker   . Rosacea   . Sick sinus syndrome (HCC)   . Thrombocytopenia (HCC)    Mild per Dr. Arbutus Ped  . Venous insufficiency     Past Surgical History:  Procedure Laterality Date  . CARDIOVERSION  08/07/2011   Procedure: CARDIOVERSION;  Surgeon: Donato Schultz, MD;  Location: [facility] OR;  Service:  Cardiovascular;  Laterality: N/A;  . ENDOVENOUS ABLATION SAPHENOUS VEIN W/ LASER Left 06-22-11   greater saphenous vein   . EP IMPLANTABLE DEVICE N/A 07/24/2014   Procedure: Pacemaker Implant;  Surgeon: Hillis Range, MD;  Location: [facility] INVASIVE CV LAB;  Service: Cardiovascular;  Laterality: N/A;  . EP IMPLANTABLE DEVICE N/A 07/25/2014   Procedure: Lead Revision;  Surgeon: Marinus Maw, MD;  Location: [facility] INVASIVE CV LAB;  Service: Cardiovascular;  Laterality: N/A;  . INSERT / REPLACE / REMOVE PACEMAKER  07/24/2014  . KNEE ARTHROSCOPY Right   . TONSILLECTOMY    . VASECTOMY      Current Outpatient Prescriptions  Medication Sig Dispense Refill  . acetaminophen (TYLENOL) 325 MG tablet Take 325 mg by mouth daily as needed (pain).    . fluticasone (FLONASE) 50 MCG/ACT nasal spray Place 1-2 sprays into both nostrils at bedtime as needed (congestion).     . furosemide (LASIX) 40 MG tablet Take 40 mg by mouth as needed for fluid.     . Lutein 6 MG TABS Take 6 mg by mouth daily as needed (eyes).     . Misc Natural Products (GLUCOSAMINE CHONDROITIN VIT D3) CAPS Take 1 capsule by mouth every other day. 1500 mg glucosamine, 1200 mg chondroitin, 1000 units vitamin D    . rivaroxaban (XARELTO) 20 MG TABS tablet Take 1 tablet (20 mg total) by mouth daily. 90 tablet 3  . sildenafil (VIAGRA) 100  MG tablet Take 100 mg by mouth daily as needed for erectile dysfunction.      No current facility-administered medications for this visit.     Allergies:    Allergies  Allergen Reactions  . Adhesive [Tape] Other (See Comments)    Makes skin raw - please use paper tape  . Codeine Other (See Comments)    "makes him crazy"    Social History:  The patient  reports that he has never smoked. He has never used smokeless tobacco. He reports that he drinks alcohol. He reports that he does not use drugs.   ROS:  Please see the history of present illness.  Denies any fevers, chills, orthopnea, PND, syncope, chest pain,  shortness of breath  PHYSICAL EXAM: VS:  BP 110/72   Pulse 62   Ht 5\' 11"  (1.803 m)   Wt 188 lb 12.8 oz (85.6 kg)   BMI 26.33 kg/m  Well nourished, well developed, in no acute distress HEENT: normal Neck: no JVD Cardiac: RRR Lungs:  clear to auscultation bilaterally, no wheezing, rhonchi or rales Abd: soft, nontender, no hepatomegaly Ext: no edema Skin: warm and dry Neuro: no focal abnormalities noted  EKG: Previous 07/20/14-heart rate 44 bpm, sinus bradycardia severe with junctional escape, complete heart block, narrow complex QRS. Previously 09/15/13-atrial flutter, 4-1 conduction, left axis deviation  ECHO 2013 - EF 65%, moderately diltated LA. 46mm  Labs: 02/02/13-creatinine 1.1, hemoglobin 14.6  ASSESSMENT AND PLAN:  1. Complete heart block-pacemaker placed. Needed lead revision on day following pacemaker original implantation. Overall doing well. Xarelto 2. Atrial fibrillation/flutter-Xarelto, previously in atrial flutter with 4:1 conduction. I informed him that he will continue with his anticoagulation. Doing very well. Dr. Pete GlatterStoneking has checked blood work. 3. Sick sinus syndrome- pacemaker. 4. Chronic anticoagulation-continue Xarelto as above. No bleeding. 5. 12 month follow up or sooner if he needs my assistance.  Donato SchultzMark Skains, MD [facility]  09/18/2015 2:41 PM

## 2015-09-28 IMAGING — CR DG CHEST 2V
2 series · 2 of 2 positions shown · non-contrast
Comparison: Chest x-ray of 07/25/2014

CLINICAL DATA: Pacemaker placement

EXAM:
CHEST  2 VIEW

[chest lat]
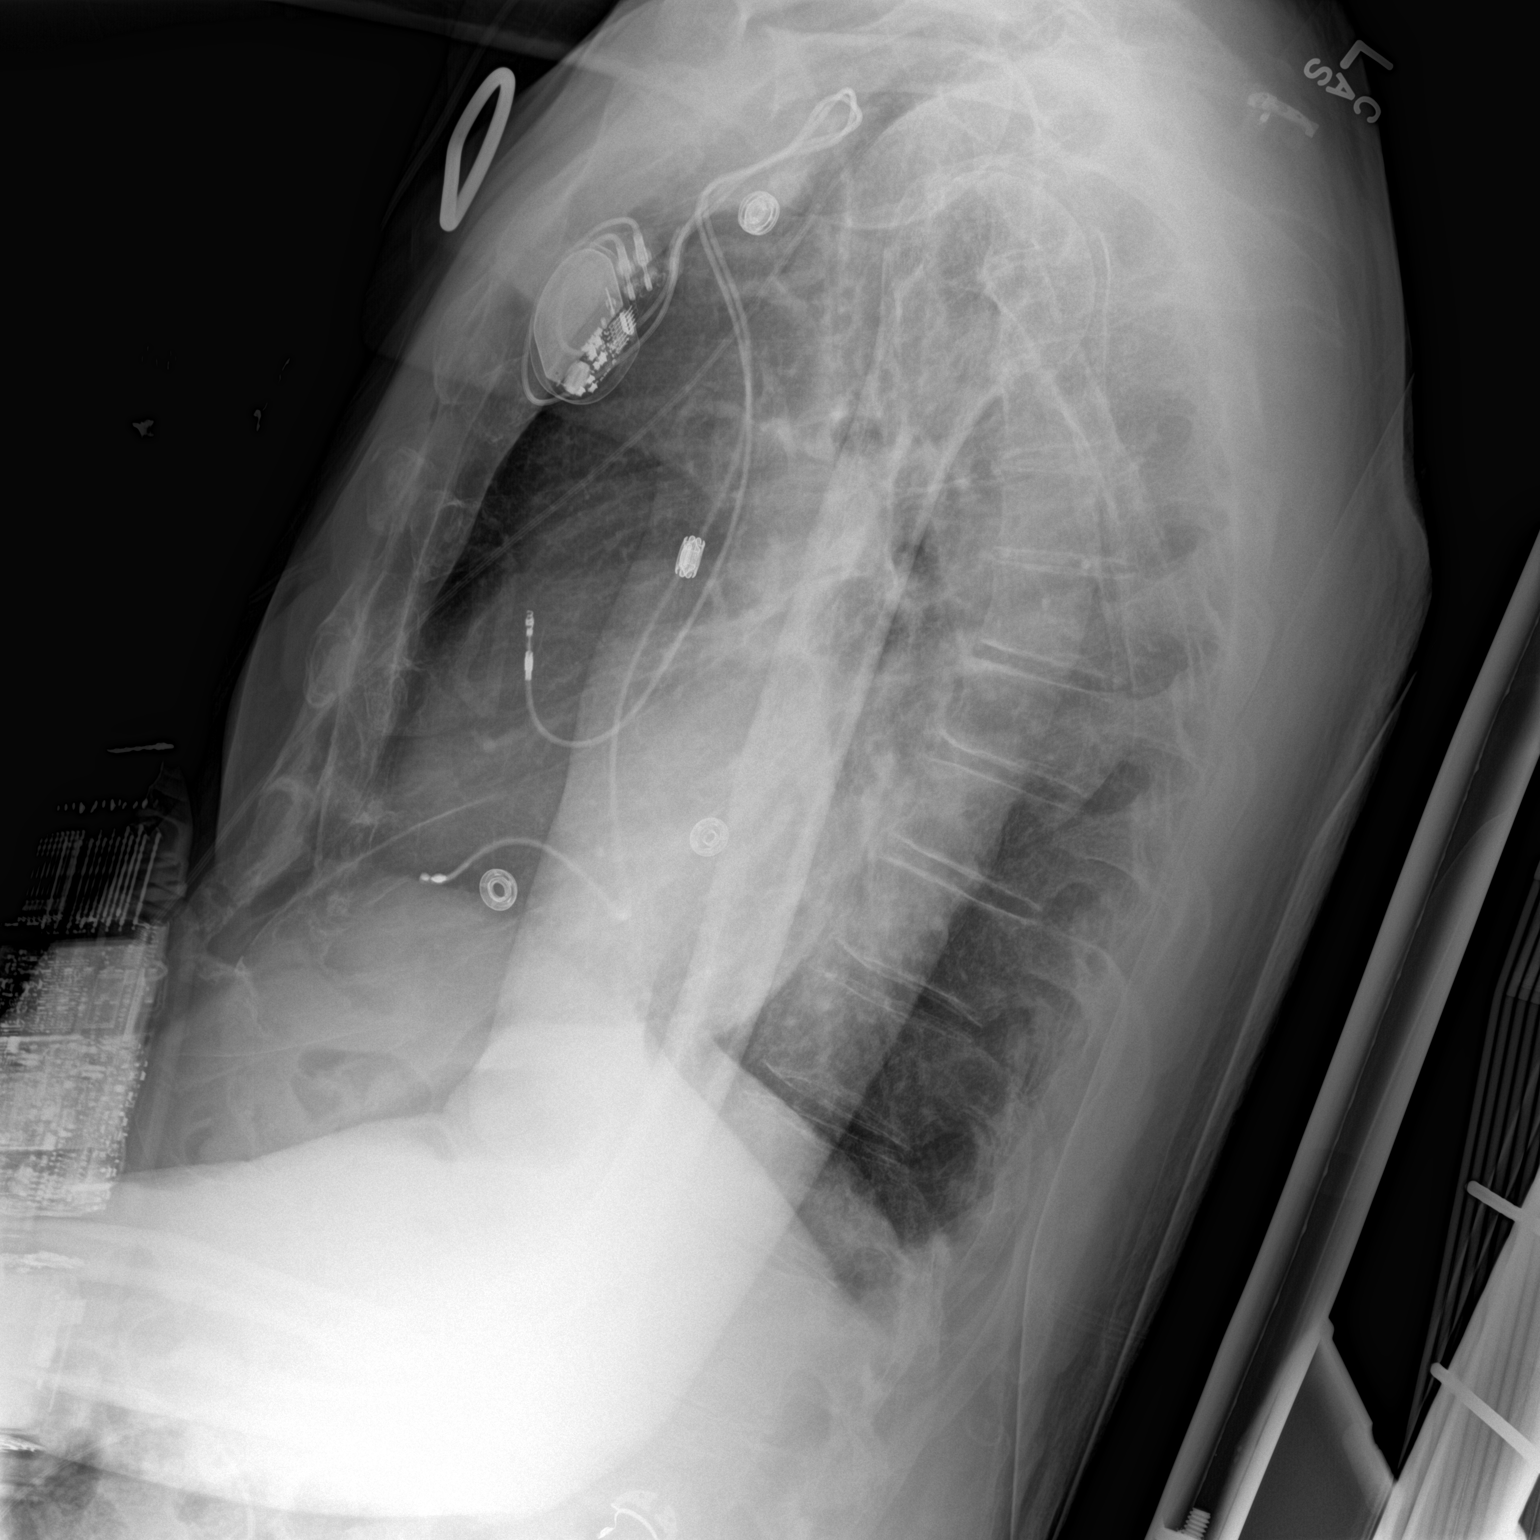

[chest ap]
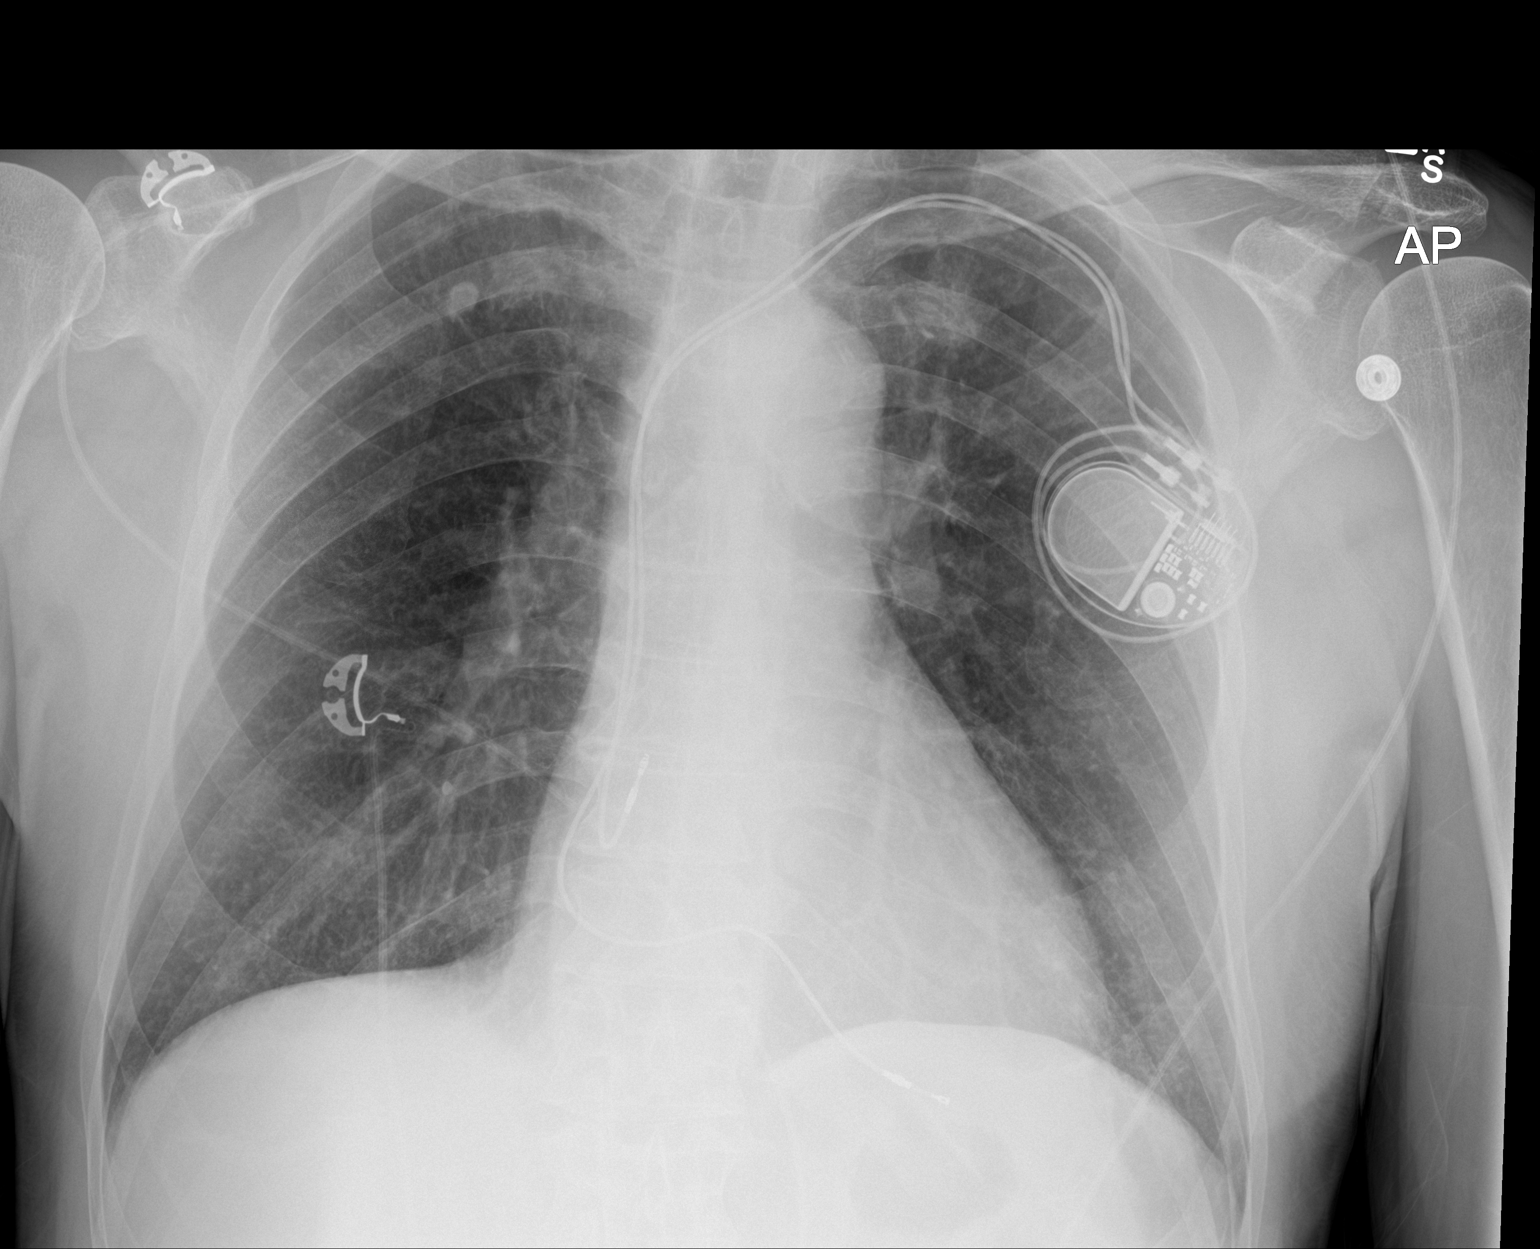

[2 of 2 positions shown; findings below may reference images not displayed]

FINDINGS: A permanent pacemaker remains. The atrial lead now appears to lie
overlie the region of the right atrium, with the ventricular lead
overlying the apex of the right ventricle. The lungs are clear. The
calcified granuloma in the right upper lobe is unchanged. No
pneumothorax is seen. Mild cardiomegaly is stable. The previous
pleural effusions have diminished.
IMPRESSION: The atrial lead now appears to overlie the right atrium. No active
lung disease. No pneumothorax.

## 2015-10-03 DIAGNOSIS — H04123 Dry eye syndrome of bilateral lacrimal glands: Secondary | ICD-10-CM | POA: Diagnosis not present

## 2015-10-03 DIAGNOSIS — H43813 Vitreous degeneration, bilateral: Secondary | ICD-10-CM | POA: Diagnosis not present

## 2015-10-03 DIAGNOSIS — H25813 Combined forms of age-related cataract, bilateral: Secondary | ICD-10-CM | POA: Diagnosis not present

## 2015-10-03 DIAGNOSIS — H01001 Unspecified blepharitis right upper eyelid: Secondary | ICD-10-CM | POA: Diagnosis not present

## 2015-10-23 DIAGNOSIS — Z23 Encounter for immunization: Secondary | ICD-10-CM | POA: Diagnosis not present

## 2015-10-30 NOTE — Progress Notes (Signed)
Electrophysiology Office Note Date: 10/31/2015  ID:  Matthew [name], DOB 05-13-1931, MRN [id number]  PCP: Ginette Otto, MD Primary Cardiologist: Anne Fu Electrophysiologist: Allred  CC: Pacemaker follow-up  Matthew [name] is a 82 y.o. male seen today for Dr Johney Frame.  He presents today for routine electrophysiology followup.  Since last being seen in our clinic, the patient reports doing very well. He had about 8 months of atrial fibrillation over the last year but did not have functional limitations related to it.  He remains active running.  He denies chest pain, palpitations, dyspnea, PND, orthopnea, nausea, vomiting, dizziness, syncope, edema, weight gain, or early satiety.  Device History: STJ dual chamber PPM implanted 2016 for sick sinus syndrome   Past Medical History:  Diagnosis Date  . Atrial fibrillation (HCC)   . Bradycardia   . Family history of adverse reaction to anesthesia    "mother had carotid OR; they put her too deep; heart stopped in middle of operation; had to do CPR; broke a bunch of ribs; brother went too deep also; they had hard time gettin him out of it"  . Gilbert's syndrome   . Macular degeneration   . Rosacea   . Sick sinus syndrome (HCC)   . Thrombocytopenia (HCC)    Mild per Dr. Arbutus Ped  . Venous insufficiency    Past Surgical History:  Procedure Laterality Date  . CARDIOVERSION  08/07/2011   Procedure: CARDIOVERSION;  Surgeon: Donato Schultz, MD;  Location: [facility] OR;  Service: Cardiovascular;  Laterality: N/A;  . ENDOVENOUS ABLATION SAPHENOUS VEIN W/ LASER Left 06-22-11   greater saphenous vein   . EP IMPLANTABLE DEVICE N/A 07/24/2014   STJ dual chamber PPM implanted by Dr Johney Frame for SSS  . EP IMPLANTABLE DEVICE N/A 07/25/2014   RA lead revision by Dr Ladona Ridgel  . KNEE ARTHROSCOPY Right   . TONSILLECTOMY    . VASECTOMY      Current Outpatient Prescriptions  Medication Sig Dispense Refill  . acetaminophen (TYLENOL) 325 MG tablet Take 325 mg by mouth  daily as needed (pain).    . fluticasone (FLONASE) 50 MCG/ACT nasal spray Place 1-2 sprays into both nostrils at bedtime as needed (congestion).     . furosemide (LASIX) 40 MG tablet Take 40 mg by mouth as needed for fluid.     . Lutein 6 MG TABS Take 6 mg by mouth daily as needed (eyes).     . Misc Natural Products (GLUCOSAMINE CHONDROITIN VIT D3) CAPS Take 1 capsule by mouth every other day. 1500 mg glucosamine, 1200 mg chondroitin, 1000 units vitamin D    . rivaroxaban (XARELTO) 20 MG TABS tablet Take 1 tablet (20 mg total) by mouth daily. 90 tablet 3  . sildenafil (VIAGRA) 100 MG tablet Take 100 mg by mouth daily as needed for erectile dysfunction.      No current facility-administered medications for this visit.     Allergies:   Adhesive [tape] and Codeine   Social History: Social History   Social History  . Marital status: Married    Spouse name: N/A  . Number of children: N/A  . Years of education: N/A   Occupational History  . Not on file.   Social History Main Topics  . Smoking status: Never Smoker  . Smokeless tobacco: Never Used  . Alcohol use Yes     Comment: 07/24/2014 "might have 3 beers/month"  . Drug use: No  . Sexual activity: Yes   Other Topics Concern  .  Not on file   Social History Narrative  . No narrative on file    Family History: Family History  Problem Relation Age of Onset  . Cancer Brother     acute lymphocytic leukemia  . Hypertension Brother   . Heart disease Brother   . Diabetes Brother   . Cancer Mother   . Heart attack Brother   . Stroke Neg Hx     Review of Systems: All other systems reviewed and are otherwise negative except as noted above.   Physical Exam: VS:  BP 120/78   Ht 5\' 11"  (1.803 m)   Wt 186 lb (84.4 kg)   BMI 25.94 kg/m  , BMI Body mass index is 25.94 kg/m.  GEN- The patient is well appearing, alert and oriented x 3 today.   HEENT: normocephalic, atraumatic; sclera clear, conjunctiva pink; hearing intact;  oropharynx clear; neck supple  Lungs- Clear to ausculation bilaterally, normal work of breathing.  No wheezes, rales, rhonchi Heart- Regular rate and rhythm  GI- soft, non-tender, non-distended, bowel sounds present  Extremities- no clubbing, cyanosis, or edema; DP/PT/radial pulses 2+ bilaterally MS- no significant deformity or atrophy Skin- warm and dry, no rash or lesion; PPM pocket well healed Psych- euthymic mood, full affect Neuro- strength and sensation are intact  PPM Interrogation- reviewed in detail today,  See PACEART report  EKG:  EKG is not ordered today.  Recent Labs: No results found for requested labs within last 8760 hours.   Wt Readings from Last 3 Encounters:  10/31/15 186 lb (84.4 kg)  09/18/15 188 lb 12.8 oz (85.6 kg)  03/07/15 194 lb 12.8 oz (88.4 kg)     Other studies Reviewed: Additional studies/ records that were reviewed today include: Dr Anne FuSkains and Dr Jenel LucksAllred's office notes  Assessment and Plan:  1.  Sick sinus syndrome/Mobitz I Normal PPM function See Pace Art report No changes today  2.  Paroxysmal atrial fibrillation Burden by device interrogation 36% V rates controlled Continue Xarelto for CHADS2VASC of 2 BMET, CBC followed by PCP    Current medicines are reviewed at length with the patient today.   The patient does not have concerns regarding his medicines.  The following changes were made today:  none  Labs/ tests ordered today include: none Orders Placed This Encounter  Procedures  . Implantable device check     Disposition:   Follow up with Arsenio KatzMerlin, Dr Anne FuSkains as scheduled, me in 1 year     Signed, Gypsy BalsamAmber Jeraldine Primeau, NP 10/31/2015 1:25 PM  [facility] [address] [place] [address] [phone] (office) [phone] (fax)

## 2015-10-31 ENCOUNTER — Encounter: Payer: Self-pay | Admitting: Nurse Practitioner

## 2015-10-31 ENCOUNTER — Encounter: Payer: Self-pay | Admitting: Internal Medicine

## 2015-10-31 ENCOUNTER — Encounter (INDEPENDENT_AMBULATORY_CARE_PROVIDER_SITE_OTHER): Payer: Self-pay

## 2015-10-31 ENCOUNTER — Ambulatory Visit (INDEPENDENT_AMBULATORY_CARE_PROVIDER_SITE_OTHER): Payer: Medicare Other | Admitting: Nurse Practitioner

## 2015-10-31 VITALS — BP 120/78 | Ht 71.0 in | Wt 186.0 lb

## 2015-10-31 DIAGNOSIS — I495 Sick sinus syndrome: Secondary | ICD-10-CM | POA: Diagnosis not present

## 2015-10-31 DIAGNOSIS — I480 Paroxysmal atrial fibrillation: Secondary | ICD-10-CM

## 2015-10-31 DIAGNOSIS — I441 Atrioventricular block, second degree: Secondary | ICD-10-CM

## 2015-10-31 LAB — CUP PACEART INCLINIC DEVICE CHECK
Date Time Interrogation Session: 20171012125444
Implantable Lead Implant Date: 20160705
MDC IDC LEAD IMPLANT DT: 20160705
MDC IDC LEAD LOCATION: 753859
MDC IDC LEAD LOCATION: 753860
MDC IDC LEAD MODEL: 1948
MDC IDC PG SERIAL: 7779900
Pulse Gen Model: 2240

## 2015-10-31 MED ORDER — RIVAROXABAN 20 MG PO TABS: 20.0000 mg | ORAL_TABLET | Freq: Every day | ORAL | 3 refills | Status: DC

## 2015-10-31 NOTE — Patient Instructions (Addendum)
Medication Instructions:   Your physician recommends that you continue on your current medications as directed. Please refer to the Current Medication list given to you today.   If you need a refill on your cardiac medications before your next appointment, please call your pharmacy.  Labwork: NONE ORDER TODAY    Testing/Procedures: NONE ORDER TODAY    Follow-Up:  Your physician wants you to follow-up in: ONE YEAR WITH  SEILER You will receive a reminder letter in the mail two months in advance. If you don't receive a letter, please call our office to schedule the follow-up appointment.  Remote monitoring is used to monitor your Pacemaker of ICD from home. This monitoring reduces the number of office visits required to check your device to one time per year. It allows us to keep an eye on the functioning of your device to ensure it is working properly. You are scheduled for a device check from home on . 1/12/18You may send your transmission at any time that day. If you have a wireless device, the transmission will be sent automatically. After your physician reviews your transmission, you will receive a postcard with your next transmission date. \   Any Other Special Instructions Will Be Listed Below (If Applicable).                                                                                                                                                   

## 2016-01-31 ENCOUNTER — Encounter: Payer: Medicare Other | Admitting: *Deleted

## 2016-01-31 ENCOUNTER — Telehealth: Payer: Self-pay | Admitting: Cardiology

## 2016-01-31 NOTE — Telephone Encounter (Signed)
Attempted to confirm remote transmission with pt. No answer and was unable to leave a message.   

## 2016-03-30 DIAGNOSIS — H16213 Exposure keratoconjunctivitis, bilateral: Secondary | ICD-10-CM | POA: Diagnosis not present

## 2016-04-03 DIAGNOSIS — H01004 Unspecified blepharitis left upper eyelid: Secondary | ICD-10-CM | POA: Diagnosis not present

## 2016-04-03 DIAGNOSIS — H01001 Unspecified blepharitis right upper eyelid: Secondary | ICD-10-CM | POA: Diagnosis not present

## 2016-04-03 DIAGNOSIS — H16213 Exposure keratoconjunctivitis, bilateral: Secondary | ICD-10-CM | POA: Diagnosis not present

## 2016-04-03 DIAGNOSIS — H04123 Dry eye syndrome of bilateral lacrimal glands: Secondary | ICD-10-CM | POA: Diagnosis not present

## 2016-05-18 DIAGNOSIS — Z23 Encounter for immunization: Secondary | ICD-10-CM | POA: Diagnosis not present

## 2016-05-20 DIAGNOSIS — Z0000 Encounter for general adult medical examination without abnormal findings: Secondary | ICD-10-CM | POA: Diagnosis not present

## 2016-05-20 DIAGNOSIS — Z1389 Encounter for screening for other disorder: Secondary | ICD-10-CM | POA: Diagnosis not present

## 2016-05-20 DIAGNOSIS — D696 Thrombocytopenia, unspecified: Secondary | ICD-10-CM | POA: Diagnosis not present

## 2016-05-20 DIAGNOSIS — I480 Paroxysmal atrial fibrillation: Secondary | ICD-10-CM | POA: Diagnosis not present

## 2016-06-17 DIAGNOSIS — Z79899 Other long term (current) drug therapy: Secondary | ICD-10-CM | POA: Diagnosis not present

## 2016-10-01 DIAGNOSIS — H5203 Hypermetropia, bilateral: Secondary | ICD-10-CM | POA: Diagnosis not present

## 2016-10-14 ENCOUNTER — Ambulatory Visit (INDEPENDENT_AMBULATORY_CARE_PROVIDER_SITE_OTHER): Payer: Medicare Other | Admitting: Cardiology

## 2016-10-14 ENCOUNTER — Encounter: Payer: Self-pay | Admitting: Cardiology

## 2016-10-14 VITALS — BP 124/72 | HR 60 | Ht 71.0 in | Wt 182.8 lb

## 2016-10-14 DIAGNOSIS — I480 Paroxysmal atrial fibrillation: Secondary | ICD-10-CM | POA: Diagnosis not present

## 2016-10-14 DIAGNOSIS — I442 Atrioventricular block, complete: Secondary | ICD-10-CM

## 2016-10-14 DIAGNOSIS — I4891 Unspecified atrial fibrillation: Secondary | ICD-10-CM

## 2016-10-14 DIAGNOSIS — Z7901 Long term (current) use of anticoagulants: Secondary | ICD-10-CM | POA: Diagnosis not present

## 2016-10-14 MED ORDER — RIVAROXABAN 20 MG PO TABS: 20.0000 mg | ORAL_TABLET | Freq: Every day | ORAL | 3 refills | Status: DC

## 2016-10-14 NOTE — Progress Notes (Signed)
[address] [place], [place]  [address] Phone: [phone] Fax:  [phone]  Date:  10/14/2016   ID:  ACHILLIES BUEHL, DOB [birth year], MRN [id number]  PCP:  Merlene Laughter, MD   History of Present Illness: Matthew [name] is a 83 y.o. male with, sick sinus syndrome, paroxysmal atrial fibrillation/flutter on chronic anticoagulation with urgent pacemaker placement previously for symptomatic bradycardia, complete heart block/AV block. heart rate was in the 30s.   Interestingly, on 09/15/2013, EKG showed atrial flutter with 4-1 conduction. Asymptomatic. Previously with his atrial fibrillation he felt poorly especially in hot weather.  STJ dual chamber PPM implanted 2016 for sick sinus syndrome  His grandson was short stop for [employer] baseball, transfer to [place] who won world series, [place]-now playing for the [employer] in [place]. Granddaughter, [employer], Clinical research associate, scholarship.    Feels much better since pacer placement.  V-Paced 83% of time. Doing well, no chest pain, no syncope.  Does not feel as pacemaker at all.  9/36/18-overall doing very well, no chest pain, no shortness of breath, no fevers, no chills, no bleeding. No strokelike symptoms. Medications taking, no issues. Pacemaker functioning well.   Wt Readings from Last 3 Encounters:  10/14/16 182 lb 12.8 oz (82.9 kg)  10/31/15 186 lb (84.4 kg)  09/18/15 188 lb 12.8 oz (85.6 kg)     Past Medical History:  Diagnosis Date  . Atrial fibrillation (HCC)   . Bradycardia   . Family history of adverse reaction to anesthesia    "mother had carotid OR; they put her too deep; heart stopped in middle of operation; had to do CPR; broke a bunch of ribs; brother went too deep also; they had hard time gettin him out of it"  . Gilbert's syndrome   . Macular degeneration   . Rosacea   . Sick sinus syndrome (HCC)   . Thrombocytopenia (HCC)    Mild per Dr. Arbutus Ped  . Venous  insufficiency     Past Surgical History:  Procedure Laterality Date  . CARDIOVERSION  08/07/2011   Procedure: CARDIOVERSION;  Surgeon: Donato Schultz, MD;  Location: [facility] OR;  Service: Cardiovascular;  Laterality: N/A;  . ENDOVENOUS ABLATION SAPHENOUS VEIN W/ LASER Left 06-22-11   greater saphenous vein   . EP IMPLANTABLE DEVICE N/A 07/24/2014   STJ dual chamber PPM implanted by Dr Johney Frame for SSS  . EP IMPLANTABLE DEVICE N/A 07/25/2014   RA lead revision by Dr Ladona Ridgel  . KNEE ARTHROSCOPY Right   . TONSILLECTOMY    . VASECTOMY      Current Outpatient Prescriptions  Medication Sig Dispense Refill  . acetaminophen (TYLENOL) 325 MG tablet Take 325 mg by mouth daily as needed (pain).    . fluticasone (FLONASE) 50 MCG/ACT nasal spray Place 1-2 sprays into both nostrils at bedtime as needed (congestion).     . furosemide (LASIX) 40 MG tablet Take 40 mg by mouth as needed for fluid.     . Lutein 6 MG TABS Take 6 mg by mouth daily as needed (eyes).     . Misc Natural Products (GLUCOSAMINE CHONDROITIN VIT D3) CAPS Take 1 capsule by mouth every other day. 1500 mg glucosamine, 1200 mg chondroitin, 1000 units vitamin D    . rivaroxaban (XARELTO) 20 MG TABS tablet Take 1 tablet (20 mg total) by mouth daily. 90 tablet 3  . sildenafil (VIAGRA) 100 MG tablet Take 100 mg by mouth daily  as needed for erectile dysfunction.      No current facility-administered medications for this visit.     Allergies:    Allergies  Allergen Reactions  . Adhesive [Tape] Other (See Comments)    Makes skin raw - please use paper tape  . Codeine Other (See Comments)    "makes him crazy"    Social History:  The patient  reports that he has never smoked. He has never used smokeless tobacco. He reports that he drinks alcohol. He reports that he does not use drugs.   ROS:  Please see the history of present illness.  Denies any fevers, chills, orthopnea, PND, syncope, chest pain, shortness of breath  PHYSICAL EXAM: VS:  BP  124/72   Pulse 60   Ht  (1.803 m)   Wt 182 lb 12.8 oz (82.9 kg)   BMI 25.50 kg/m  GEN: Well nourished, well developed, in no acute distress  HEENT: normal  Neck: no JVD, carotid bruits, or masses Cardiac: RRR; no murmurs, rubs, or gallops,no edema  Respiratory:  clear to auscultation bilaterally, normal work of breathing GI: soft, nontender, nondistended, + BS MS: no deformity or atrophy  Skin: warm and dry, no rash Neuro:  Alert and Oriented x 3, Strength and sensation are intact Psych: euthymic mood, full affect   EKG: Today 10/14/16 - A-V paced 60 no other changes personally viewed-prior  07/20/14-heart rate 44 bpm, sinus bradycardia severe with junctional escape, complete heart block, narrow complex QRS. Previously 09/15/13-atrial flutter, 4-1 conduction, left axis deviation  ECHO 2013 - EF 65%, moderately diltated LA. 46mm  Labs: 02/02/13-creatinine 1.1, hemoglobin 14.6  ASSESSMENT AND PLAN:  1. Complete heart block-pacemaker placed. Needed lead revision on day following pacemaker original implantation. Overall doing well. Xarelto. No changes made, stable. 2. Atrial fibrillation/flutter-Xarelto, previously in atrial flutter with 4:1 conduction. I informed him that he will continue with his anticoagulation. Doing very well. Dr. Pete Glatter has checked blood work. Hemoglobin has remained stable, 13.5, creatinine 1.2. 3. Sick sinus syndrome- pacemaker. Reviewed last note from [facility]. His last remote transmission was 01/31/2016. 4. Chronic anticoagulation-continue Xarelto as above. No bleeding. Seems to be doing quite well. Discussed rationale for Xarelto. 5. 12 month follow up or sooner if he needs my assistance.  Donato Schultz, MD [facility]  10/14/2016 11:12 AM

## 2016-10-14 NOTE — Patient Instructions (Signed)

## 2016-10-21 ENCOUNTER — Ambulatory Visit (INDEPENDENT_AMBULATORY_CARE_PROVIDER_SITE_OTHER): Payer: Medicare Other | Admitting: *Deleted

## 2016-10-21 ENCOUNTER — Encounter: Payer: Self-pay | Admitting: Cardiology

## 2016-10-21 DIAGNOSIS — I495 Sick sinus syndrome: Secondary | ICD-10-CM

## 2016-10-21 DIAGNOSIS — I442 Atrioventricular block, complete: Secondary | ICD-10-CM | POA: Diagnosis not present

## 2016-10-21 NOTE — Progress Notes (Signed)
Remote pacemaker transmission.   

## 2016-10-26 LAB — CUP PACEART REMOTE DEVICE CHECK
Brady Statistic AP VP Percent: 97 %
Brady Statistic AS VP Percent: 2.5 %
Brady Statistic AS VS Percent: 1 %
Brady Statistic RA Percent Paced: 75 %
Brady Statistic RV Percent Paced: 99 %
Date Time Interrogation Session: 20181003060032
Implantable Lead Implant Date: 20160705
Implantable Lead Location: 753860
Lead Channel Impedance Value: 610 Ohm
Lead Channel Pacing Threshold Pulse Width: 0.4 ms
Lead Channel Pacing Threshold Pulse Width: 0.5 ms
Lead Channel Sensing Intrinsic Amplitude: 1.5 mV
Lead Channel Sensing Intrinsic Amplitude: 12 mV
Lead Channel Setting Pacing Amplitude: 1.625
Lead Channel Setting Pacing Pulse Width: 0.4 ms
Lead Channel Setting Sensing Sensitivity: 2 mV
MDC IDC LEAD IMPLANT DT: 20160705
MDC IDC LEAD LOCATION: 753859
MDC IDC MSMT BATTERY REMAINING LONGEVITY: 124 mo
MDC IDC MSMT BATTERY REMAINING PERCENTAGE: 95.5 %
MDC IDC MSMT BATTERY VOLTAGE: 2.98 V
MDC IDC MSMT LEADCHNL RA IMPEDANCE VALUE: 430 Ohm
MDC IDC MSMT LEADCHNL RA PACING THRESHOLD AMPLITUDE: 0.625 V
MDC IDC MSMT LEADCHNL RV PACING THRESHOLD AMPLITUDE: 0.5 V
MDC IDC PG IMPLANT DT: 20160705
MDC IDC PG SERIAL: 7779900
MDC IDC SET LEADCHNL RV PACING AMPLITUDE: 0.75 V
MDC IDC STAT BRADY AP VS PERCENT: 1 %
Pulse Gen Model: 2240

## 2016-11-10 NOTE — Progress Notes (Signed)
Electrophysiology Office Note Date: 11/11/2016  ID:  Matthew [name], DOB Jun 12, 1931, MRN [id number]  PCP: Merlene LaughterStoneking, Hal, MD Primary Cardiologist: Anne FuSkains Electrophysiologist: Allred  CC: Pacemaker follow-up  Matthew MainsJohn N Jain is a 83 y.o. male seen today for Dr Johney FrameAllred.  He presents today for routine electrophysiology followup.  Since last being seen in our clinic, the patient reports doing very well. He remains active and is unaware of AF.  He denies chest pain, palpitations, dyspnea, PND, orthopnea, nausea, vomiting, dizziness, syncope, edema, weight gain, or early satiety.  Device History: STJ dual chamber PPM implanted 2016 for sick sinus syndrome   Past Medical History:  Diagnosis Date  . Atrial fibrillation (HCC)   . Bradycardia   . Family history of adverse reaction to anesthesia    "mother had carotid OR; they put her too deep; heart stopped in middle of operation; had to do CPR; broke a bunch of ribs; brother went too deep also; they had hard time gettin him out of it"  . Gilbert's syndrome   . Macular degeneration   . Rosacea   . Sick sinus syndrome (HCC)   . Thrombocytopenia (HCC)    Mild per Dr. Arbutus PedMohamed  . Venous insufficiency    Past Surgical History:  Procedure Laterality Date  . CARDIOVERSION  08/07/2011   Procedure: CARDIOVERSION;  Surgeon: Donato SchultzMark Skains, MD;  [facility];  Service: Cardiovascular;  Laterality: N/A;  . ENDOVENOUS ABLATION SAPHENOUS VEIN W/ LASER Left 06-22-11   greater saphenous vein   . EP IMPLANTABLE DEVICE N/A 07/24/2014   STJ dual chamber PPM implanted by Dr Johney FrameAllred for SSS  . EP IMPLANTABLE DEVICE N/A 07/25/2014   RA lead revision by Dr Ladona Ridgelaylor  . KNEE ARTHROSCOPY Right   . TONSILLECTOMY    . VASECTOMY      Current Outpatient Prescriptions  Medication Sig Dispense Refill  . acetaminophen (TYLENOL) 325 MG tablet Take 325 mg by mouth daily as needed (pain).    . fluticasone (FLONASE) 50 MCG/ACT nasal spray Place 1-2 sprays into both nostrils at  bedtime as needed (congestion).     . furosemide (LASIX) 40 MG tablet Take 40 mg by mouth daily as needed for fluid.     . Lutein 6 MG TABS Take 6 mg by mouth daily as needed (eyes).     . Misc Natural Products (GLUCOSAMINE CHONDROITIN VIT D3) CAPS Take 1 capsule by mouth every other day. 1500 mg glucosamine, 1200 mg chondroitin, 1000 units vitamin D    . rivaroxaban (XARELTO) 20 MG TABS tablet Take 1 tablet (20 mg total) by mouth daily. 90 tablet 3  . sildenafil (VIAGRA) 100 MG tablet Take 100 mg by mouth daily as needed for erectile dysfunction.      No current facility-administered medications for this visit.     Allergies:   Adhesive [tape] and Codeine   Social History: Social History   Social History  . Marital status: Married    Spouse name: N/A  . Number of children: N/A  . Years of education: N/A   Occupational History  . Not on file.   Social History Main Topics  . Smoking status: Never Smoker  . Smokeless tobacco: Never Used  . Alcohol use Yes     Comment: 07/24/2014 "might have 3 beers/month"  . Drug use: No  . Sexual activity: Yes   Other Topics Concern  . Not on file   Social History Narrative  . No narrative on file  Family History: Family History  Problem Relation Age of Onset  . Cancer Brother        acute lymphocytic leukemia  . Hypertension Brother   . Heart disease Brother   . Diabetes Brother   . Cancer Mother   . Heart attack Brother   . Stroke Neg Hx     Review of Systems: All other systems reviewed and are otherwise negative except as noted above.   Physical Exam: VS:  BP 136/72   Pulse 71   Ht 5\' 11"  (1.803 m)   Wt 184 lb (83.5 kg)   SpO2 97%   BMI 25.66 kg/m  , BMI Body mass index is 25.66 kg/m.  GEN- The patient is well appearing, alert and oriented x 3 today.   HEENT: normocephalic, atraumatic; sclera clear, conjunctiva pink; hearing intact; oropharynx clear; neck supple  Lungs- Clear to ausculation bilaterally, normal  work of breathing.  No wheezes, rales, rhonchi Heart- Regular rate and rhythm  GI- soft, non-tender, non-distended, bowel sounds present  Extremities- no clubbing, cyanosis, or edema MS- no significant deformity or atrophy Skin- warm and dry, no rash or lesion; PPM pocket well healed Psych- euthymic mood, full affect Neuro- strength and sensation are intact  PPM Interrogation- reviewed in detail today,  See PACEART report  EKG:  EKG is not ordered today.  Recent Labs: No results found for requested labs within last 8760 hours.   Wt Readings from Last 3 Encounters:  11/11/16 184 lb (83.5 kg)  10/14/16 182 lb 12.8 oz (82.9 kg)  10/31/15 186 lb (84.4 kg)     Other studies Reviewed: Additional studies/ records that were reviewed today include: Dr Anne Fu and Dr Jenel Lucks office notes  Assessment and Plan:  1.  Sick sinus syndrome/Mobitz I Normal PPM function See Pace Art report No changes today  2.  Paroxysmal atrial fibrillation Burden by device interrogation 0% since last office visit with me Continue Xarelto for CHADS2VASC of 2 BMET, CBC followed by PCP    Current medicines are reviewed at length with the patient today.   The patient does not have concerns regarding his medicines.  The following changes were made today:  none  Labs/ tests ordered today include: none No orders of the defined types were placed in this encounter.    Disposition:   Follow up with Arsenio Katz, Dr Anne Fu as scheduled, me in 1 year     Signed, Gypsy Balsam, NP 11/11/2016 11:56 AM  [facility] [address] [place] [address] [phone] (office) [phone] (fax)

## 2016-11-11 ENCOUNTER — Ambulatory Visit (INDEPENDENT_AMBULATORY_CARE_PROVIDER_SITE_OTHER): Payer: Medicare Other | Admitting: Nurse Practitioner

## 2016-11-11 ENCOUNTER — Encounter (INDEPENDENT_AMBULATORY_CARE_PROVIDER_SITE_OTHER): Payer: Self-pay

## 2016-11-11 ENCOUNTER — Encounter: Payer: Self-pay | Admitting: Nurse Practitioner

## 2016-11-11 VITALS — BP 136/72 | HR 71 | Ht 71.0 in | Wt 184.0 lb

## 2016-11-11 DIAGNOSIS — I480 Paroxysmal atrial fibrillation: Secondary | ICD-10-CM | POA: Diagnosis not present

## 2016-11-11 DIAGNOSIS — Z23 Encounter for immunization: Secondary | ICD-10-CM | POA: Diagnosis not present

## 2016-11-11 DIAGNOSIS — I495 Sick sinus syndrome: Secondary | ICD-10-CM

## 2016-11-11 NOTE — Patient Instructions (Addendum)
Medication Instructions:   Your physician recommends that you continue on your current medications as directed. Please refer to the Current Medication list given to you today.   If you need a refill on your cardiac medications before your next appointment, please call your pharmacy.  Labwork: NONE ORDERED  TODAY    Testing/Procedures: NONE ORDERED  TODAY    Follow-Up: Your physician wants you to follow-up in: ONE YEAR WITH  SEILER   You will receive a reminder letter in the mail two months in advance. If you don't receive a letter, please call our office to schedule the follow-up appointment.     Any Other Special Instructions Will Be Listed Below (If Applicable).                                                                                                                                                   

## 2016-11-12 LAB — CUP PACEART INCLINIC DEVICE CHECK
Date Time Interrogation Session: 20181025075917
Implantable Lead Implant Date: 20160705
Implantable Lead Location: 753860
MDC IDC LEAD IMPLANT DT: 20160705
MDC IDC LEAD LOCATION: 753859
MDC IDC PG IMPLANT DT: 20160705
Pulse Gen Serial Number: 7779900

## 2017-01-20 ENCOUNTER — Ambulatory Visit (INDEPENDENT_AMBULATORY_CARE_PROVIDER_SITE_OTHER): Payer: Medicare Other | Admitting: *Deleted

## 2017-01-20 DIAGNOSIS — I495 Sick sinus syndrome: Secondary | ICD-10-CM | POA: Diagnosis not present

## 2017-01-21 NOTE — Progress Notes (Signed)
Remote pacemaker transmission.   

## 2017-01-22 ENCOUNTER — Encounter: Payer: Self-pay | Admitting: Cardiology

## 2017-01-25 ENCOUNTER — Telehealth: Payer: Self-pay | Admitting: Cardiology

## 2017-01-25 NOTE — Telephone Encounter (Signed)
Pt calling requesting a 30 day free card for Xarelto. I informed pt that I was leaving the card at the front desk for pt to pick up and if he has any other problems, questions or concerns to call the office. Pt verbalized understanding.

## 2017-02-05 LAB — CUP PACEART REMOTE DEVICE CHECK
Battery Remaining Longevity: 119 mo
Battery Remaining Percentage: 95.5 %
Battery Voltage: 2.98 V
Brady Statistic RA Percent Paced: 83 %
Brady Statistic RV Percent Paced: 99 %
Date Time Interrogation Session: 20190102070017
Implantable Lead Implant Date: 20160705
Implantable Lead Location: 753859
Implantable Lead Model: 1948
Implantable Pulse Generator Implant Date: 20160705
Lead Channel Impedance Value: 390 Ohm
Lead Channel Pacing Threshold Amplitude: 0.625 V
Lead Channel Pacing Threshold Pulse Width: 0.5 ms
Lead Channel Sensing Intrinsic Amplitude: 1.5 mV
Lead Channel Sensing Intrinsic Amplitude: 12 mV
Lead Channel Setting Pacing Amplitude: 1.625
Lead Channel Setting Sensing Sensitivity: 2 mV
MDC IDC LEAD IMPLANT DT: 20160705
MDC IDC LEAD LOCATION: 753860
MDC IDC MSMT LEADCHNL RV IMPEDANCE VALUE: 600 Ohm
MDC IDC MSMT LEADCHNL RV PACING THRESHOLD AMPLITUDE: 0.5 V
MDC IDC MSMT LEADCHNL RV PACING THRESHOLD PULSEWIDTH: 0.4 ms
MDC IDC SET LEADCHNL RV PACING AMPLITUDE: 0.75 V
MDC IDC SET LEADCHNL RV PACING PULSEWIDTH: 0.4 ms
MDC IDC STAT BRADY AP VP PERCENT: 97 %
MDC IDC STAT BRADY AP VS PERCENT: 1 %
MDC IDC STAT BRADY AS VP PERCENT: 2.7 %
MDC IDC STAT BRADY AS VS PERCENT: 1 %
Pulse Gen Model: 2240
Pulse Gen Serial Number: 7779900

## 2017-04-21 ENCOUNTER — Ambulatory Visit (INDEPENDENT_AMBULATORY_CARE_PROVIDER_SITE_OTHER): Payer: Medicare Other | Admitting: *Deleted

## 2017-04-21 DIAGNOSIS — I495 Sick sinus syndrome: Secondary | ICD-10-CM

## 2017-04-21 NOTE — Progress Notes (Signed)
Remote pacemaker transmission.   

## 2017-04-22 ENCOUNTER — Encounter: Payer: Self-pay | Admitting: Cardiology

## 2017-04-23 ENCOUNTER — Encounter: Payer: Medicare Other | Admitting: Nurse Practitioner

## 2017-05-13 LAB — CUP PACEART REMOTE DEVICE CHECK
Battery Remaining Longevity: 120 mo
Battery Remaining Percentage: 95.5 %
Battery Voltage: 2.98 V
Brady Statistic AP VP Percent: 98 %
Brady Statistic AS VS Percent: 1 %
Brady Statistic RA Percent Paced: 92 %
Implantable Lead Implant Date: 20160705
Implantable Lead Implant Date: 20160705
Implantable Lead Location: 753859
Implantable Pulse Generator Implant Date: 20160705
Lead Channel Impedance Value: 400 Ohm
Lead Channel Pacing Threshold Amplitude: 0.625 V
Lead Channel Pacing Threshold Pulse Width: 0.4 ms
Lead Channel Pacing Threshold Pulse Width: 0.5 ms
Lead Channel Sensing Intrinsic Amplitude: 1.7 mV
Lead Channel Setting Pacing Amplitude: 0.75 V
MDC IDC LEAD LOCATION: 753860
MDC IDC MSMT LEADCHNL RV IMPEDANCE VALUE: 580 Ohm
MDC IDC MSMT LEADCHNL RV PACING THRESHOLD AMPLITUDE: 0.5 V
MDC IDC MSMT LEADCHNL RV SENSING INTR AMPL: 12 mV
MDC IDC PG SERIAL: 7779900
MDC IDC SESS DTM: 20190403060018
MDC IDC SET LEADCHNL RA PACING AMPLITUDE: 1.625
MDC IDC SET LEADCHNL RV PACING PULSEWIDTH: 0.4 ms
MDC IDC SET LEADCHNL RV SENSING SENSITIVITY: 2 mV
MDC IDC STAT BRADY AP VS PERCENT: 1 %
MDC IDC STAT BRADY AS VP PERCENT: 1.5 %
MDC IDC STAT BRADY RV PERCENT PACED: 99 %

## 2017-05-24 DIAGNOSIS — Z0000 Encounter for general adult medical examination without abnormal findings: Secondary | ICD-10-CM | POA: Diagnosis not present

## 2017-05-24 DIAGNOSIS — D696 Thrombocytopenia, unspecified: Secondary | ICD-10-CM | POA: Diagnosis not present

## 2017-05-24 DIAGNOSIS — N183 Chronic kidney disease, stage 3 (moderate): Secondary | ICD-10-CM | POA: Diagnosis not present

## 2017-05-24 DIAGNOSIS — I480 Paroxysmal atrial fibrillation: Secondary | ICD-10-CM | POA: Diagnosis not present

## 2017-07-04 NOTE — Progress Notes (Signed)
Electrophysiology Office Note Date: 07/05/2017  ID:  Matthew MainsJohn N [name], DOB Aug 04, 1931, MRN [id number]  PCP: Merlene LaughterStoneking, Hal, MD Primary Cardiologist: Anne FuSkains Electrophysiologist: Allred  CC: Pacemaker follow-up  Matthew [name] is a 84 y.o. male seen today for Dr Johney FrameAllred.  He presents today for routine electrophysiology followup.  Since last being seen in our clinic, the patient reports doing very well.  He denies chest pain, palpitations, dyspnea, PND, orthopnea, nausea, vomiting, dizziness, syncope, edema, weight gain, or early satiety.  Device History: STJ dual chamber PPM implanted 2016 for sick sinus syndrome  Past Medical History:  Diagnosis Date  . Chronic venous insufficiency 02/06/2011  . Complete heart block (HCC) 07/20/2014   a. s/p STJ dual chamber PPM   . Family history of adverse reaction to anesthesia    "mother had carotid OR; they put her too deep; heart stopped in middle of operation; had to do CPR; broke a bunch of ribs; brother went too deep also; they had hard time gettin him out of it"  . Gilbert's syndrome   . Macular degeneration   . Paroxysmal atrial fibrillation (HCC) 07/20/2014  . Rosacea   . Thrombocytopenia (HCC)    Mild per Dr. Arbutus PedMohamed  . Varicose veins of lower extremities with other complications 02/06/2011   Past Surgical History:  Procedure Laterality Date  . CARDIOVERSION  08/07/2011   Procedure: CARDIOVERSION;  Surgeon: Donato SchultzMark Skains, MD;  [facility];  Service: Cardiovascular;  Laterality: N/A;  . ENDOVENOUS ABLATION SAPHENOUS VEIN W/ LASER Left 06-22-11   greater saphenous vein   . EP IMPLANTABLE DEVICE N/A 07/24/2014   STJ dual chamber PPM implanted by Dr Johney FrameAllred for SSS  . EP IMPLANTABLE DEVICE N/A 07/25/2014   RA lead revision by Dr Ladona Ridgelaylor  . KNEE ARTHROSCOPY Right   . TONSILLECTOMY    . VASECTOMY      Current Outpatient Medications  Medication Sig Dispense Refill  . acetaminophen (TYLENOL) 325 MG tablet Take 325 mg by mouth daily as needed  (pain).    . fluticasone (FLONASE) 50 MCG/ACT nasal spray Place 1-2 sprays into both nostrils at bedtime as needed (congestion).     . furosemide (LASIX) 40 MG tablet Take 40 mg by mouth daily as needed for fluid.     . rivaroxaban (XARELTO) 20 MG TABS tablet Take 1 tablet (20 mg total) by mouth daily. 90 tablet 3  . sildenafil (VIAGRA) 100 MG tablet Take 100 mg by mouth daily as needed for erectile dysfunction.      No current facility-administered medications for this visit.     Allergies:   Adhesive [tape] and Codeine   Social History: Social History   Socioeconomic History  . Marital status: Married    Spouse name: Not on file  . Number of children: Not on file  . Years of education: Not on file  . Highest education level: Not on file  Occupational History  . Not on file  Social Needs  . Financial resource strain: Not on file  . Food insecurity:    Worry: Not on file    Inability: Not on file  . Transportation needs:    Medical: Not on file    Non-medical: Not on file  Tobacco Use  . Smoking status: Never Smoker  . Smokeless tobacco: Never Used  Substance and Sexual Activity  . Alcohol use: Yes    Comment: 07/24/2014 "might have 3 beers/month"  . Drug use: No  . Sexual activity: Yes  Lifestyle  . Physical activity:    Days per week: Not on file    Minutes per session: Not on file  . Stress: Not on file  Relationships  . Social connections:    Talks on phone: Not on file    Gets together: Not on file    Attends religious service: Not on file    Active member of club or organization: Not on file    Attends meetings of clubs or organizations: Not on file    Relationship status: Not on file  . Intimate partner violence:    Fear of current or ex partner: Not on file    Emotionally abused: Not on file    Physically abused: Not on file    Forced sexual activity: Not on file  Other Topics Concern  . Not on file  Social History Narrative  . Not on file    Family  History: Family History  Problem Relation Age of Onset  . Cancer Brother        acute lymphocytic leukemia  . Hypertension Brother   . Heart disease Brother   . Diabetes Brother   . Cancer Mother   . Heart attack Brother   . Stroke Neg Hx      Review of Systems: All other systems reviewed and are otherwise negative except as noted above.   Physical Exam: VS:  BP 118/70   Pulse 62   Ht 5\' 11"  (1.803 m)   Wt 184 lb (83.5 kg)   SpO2 98%   BMI 25.66 kg/m  , BMI Body mass index is 25.66 kg/m.  GEN- The patient is well appearing, alert and oriented x 3 today.   HEENT: normocephalic, atraumatic; sclera clear, conjunctiva pink; hearing intact; oropharynx clear; neck supple  Lungs- Clear to ausculation bilaterally, normal work of breathing.  No wheezes, rales, rhonchi Heart- Regular rate and rhythm   GI- soft, non-tender, non-distended, bowel sounds present  Extremities- no clubbing, cyanosis, or edema  MS- no significant deformity or atrophy Skin- warm and dry, no rash or lesion; PPM pocket well healed Psych- euthymic mood, full affect Neuro- strength and sensation are intact  PPM Interrogation- reviewed in detail today,  See PACEART report  EKG:  EKG is not ordered today.  Recent Labs: No results found for requested labs within last 8760 hours.   Wt Readings from Last 3 Encounters:  07/05/17 184 lb (83.5 kg)  11/11/16 184 lb (83.5 kg)  10/14/16 182 lb 12.8 oz (82.9 kg)     Assessment and Plan:  1.  Symptomatic bradycardia/sick sinus syndrome Normal PPM function See Pace Art report No changes today  2.  Paroxysmal atrial fibrillation Burden by device interrogation 4.5% (asymptomatic and clustered week of 12/29/17) Continue Xarelto for CHADS2VASC of 2 PCP following BMET, CBC    Current medicines are reviewed at length with the patient today.   The patient does not have concerns regarding his medicines.  The following changes were made today:  none  Labs/  tests ordered today include: none Orders Placed This Encounter  Procedures  . CUP PACEART INCLINIC DEVICE CHECK     Disposition:   Follow up with Arsenio Katz, Dr Johney Frame 1 year    Signed, Gypsy Balsam, NP 07/05/2017 11:13 AM  [facility] [address] [place] [address] [phone] (office) [phone] (fax)

## 2017-07-05 ENCOUNTER — Encounter: Payer: Self-pay | Admitting: Nurse Practitioner

## 2017-07-05 ENCOUNTER — Ambulatory Visit: Payer: Medicare Other | Admitting: Nurse Practitioner

## 2017-07-05 VITALS — BP 118/70 | HR 62 | Ht 71.0 in | Wt 184.0 lb

## 2017-07-05 DIAGNOSIS — I495 Sick sinus syndrome: Secondary | ICD-10-CM | POA: Diagnosis not present

## 2017-07-05 DIAGNOSIS — I480 Paroxysmal atrial fibrillation: Secondary | ICD-10-CM

## 2017-07-05 LAB — CUP PACEART INCLINIC DEVICE CHECK
Date Time Interrogation Session: 20190617102510
Implantable Lead Implant Date: 20160705
Implantable Lead Implant Date: 20160705
Implantable Lead Location: 753860
Implantable Pulse Generator Implant Date: 20160705
MDC IDC LEAD LOCATION: 753859
Pulse Gen Model: 2240
Pulse Gen Serial Number: 7779900

## 2017-07-05 NOTE — Patient Instructions (Addendum)
Medication Instructions:   Your physician recommends that you continue on your current medications as directed. Please refer to the Current Medication list given to you today.  If you need a refill on your cardiac medications before your next appointment, please call your pharmacy.  Labwork: NONE ORDERED  TODAY    Testing/Procedures: NONE ORDERED  TODAY    Follow-Up: Your physician wants you to follow-up in: ONE YEAR WITH ALLRED  You will receive a reminder letter in the mail two months in advance. If you don't receive a letter, please call our office to schedule the follow-up appointment.     Remote monitoring is used to monitor your Pacemaker of ICD from home. This monitoring reduces the number of office visits required to check your device to one time per year. It allows us to keep an eye on the functioning of your device to ensure it is working properly. You are scheduled for a device check from home on .  7-3-19You may send your transmission at any time that day. If you have a wireless device, the transmission will be sent automatically. After your physician reviews your transmission, you will receive a postcard with your next transmission date.     Any Other Special Instructions Will Be Listed Below (If Applicable).

## 2017-07-21 ENCOUNTER — Encounter: Payer: Medicare Other | Admitting: *Deleted

## 2017-07-21 ENCOUNTER — Telehealth: Payer: Self-pay | Admitting: Cardiology

## 2017-07-21 DIAGNOSIS — S199XXA Unspecified injury of neck, initial encounter: Secondary | ICD-10-CM | POA: Diagnosis not present

## 2017-07-21 DIAGNOSIS — I77810 Thoracic aortic ectasia: Secondary | ICD-10-CM | POA: Diagnosis not present

## 2017-07-21 DIAGNOSIS — S3991XA Unspecified injury of abdomen, initial encounter: Secondary | ICD-10-CM | POA: Diagnosis not present

## 2017-07-21 DIAGNOSIS — J8410 Pulmonary fibrosis, unspecified: Secondary | ICD-10-CM | POA: Diagnosis not present

## 2017-07-21 NOTE — Telephone Encounter (Signed)
LMOVM reminding pt to send remote transmission.   

## 2017-07-22 DIAGNOSIS — I6523 Occlusion and stenosis of bilateral carotid arteries: Secondary | ICD-10-CM | POA: Diagnosis not present

## 2017-07-23 ENCOUNTER — Encounter: Payer: Self-pay | Admitting: Cardiology

## 2017-08-02 ENCOUNTER — Ambulatory Visit (INDEPENDENT_AMBULATORY_CARE_PROVIDER_SITE_OTHER): Payer: Medicare Other | Admitting: *Deleted

## 2017-08-02 DIAGNOSIS — I495 Sick sinus syndrome: Secondary | ICD-10-CM

## 2017-08-02 NOTE — Progress Notes (Signed)
Remote pacemaker transmission.   

## 2017-08-03 LAB — CUP PACEART REMOTE DEVICE CHECK
Battery Remaining Longevity: 123 mo
Battery Voltage: 2.98 V
Brady Statistic AP VP Percent: 99 %
Brady Statistic AS VP Percent: 1 %
Brady Statistic AS VS Percent: 1 %
Brady Statistic RA Percent Paced: 99 %
Brady Statistic RV Percent Paced: 99 %
Implantable Lead Implant Date: 20160705
Implantable Lead Implant Date: 20160705
Implantable Lead Location: 753859
Implantable Lead Model: 1948
Implantable Pulse Generator Implant Date: 20160705
Lead Channel Impedance Value: 390 Ohm
Lead Channel Impedance Value: 590 Ohm
Lead Channel Pacing Threshold Amplitude: 0.625 V
Lead Channel Pacing Threshold Pulse Width: 0.4 ms
Lead Channel Pacing Threshold Pulse Width: 0.5 ms
Lead Channel Setting Pacing Amplitude: 0.75 V
MDC IDC LEAD LOCATION: 753860
MDC IDC MSMT BATTERY REMAINING PERCENTAGE: 95.5 %
MDC IDC MSMT LEADCHNL RA SENSING INTR AMPL: 1.3 mV
MDC IDC MSMT LEADCHNL RV PACING THRESHOLD AMPLITUDE: 0.5 V
MDC IDC MSMT LEADCHNL RV SENSING INTR AMPL: 12 mV
MDC IDC PG SERIAL: 7779900
MDC IDC SESS DTM: 20190715055031
MDC IDC SET LEADCHNL RA PACING AMPLITUDE: 1.625
MDC IDC SET LEADCHNL RV PACING PULSEWIDTH: 0.4 ms
MDC IDC SET LEADCHNL RV SENSING SENSITIVITY: 2 mV
MDC IDC STAT BRADY AP VS PERCENT: 1 %

## 2017-08-04 ENCOUNTER — Encounter: Payer: Self-pay | Admitting: Cardiology

## 2017-10-06 DIAGNOSIS — H52223 Regular astigmatism, bilateral: Secondary | ICD-10-CM | POA: Diagnosis not present

## 2017-10-19 ENCOUNTER — Encounter: Payer: Self-pay | Admitting: Cardiology

## 2017-10-19 ENCOUNTER — Ambulatory Visit: Payer: Medicare Other | Admitting: Cardiology

## 2017-10-19 VITALS — BP 104/72 | HR 60 | Ht 71.0 in | Wt 178.8 lb

## 2017-10-19 DIAGNOSIS — I480 Paroxysmal atrial fibrillation: Secondary | ICD-10-CM

## 2017-10-19 DIAGNOSIS — Z7901 Long term (current) use of anticoagulants: Secondary | ICD-10-CM

## 2017-10-19 DIAGNOSIS — I442 Atrioventricular block, complete: Secondary | ICD-10-CM

## 2017-10-19 DIAGNOSIS — I495 Sick sinus syndrome: Secondary | ICD-10-CM

## 2017-10-19 NOTE — Progress Notes (Signed)
Cardiology Office Note:    Date:  10/19/2017   ID:  Matthew [name], DOB July 27, 1931, MRN [id number]  PCP:  Merlene Laughter, MD  Cardiologist:  No primary care provider on file.  Electrophysiologist:  None   Referring MD: Merlene Laughter, MD     History of Present Illness:    Matthew [name] is a 84 y.o. male here for follow-up of pacemaker, atrial flutter. At [facility] dual-chamber pacemaker implanted in 2016 for sick sinus syndrome.  Atrial flutter at one point showed 4-1 conduction.  Had significant bradycardia prior to his pacemaker with heart rate in the 30s.  Instant relief.  Ventricular pacing previously 83%.  Denies any fevers chills nausea vomiting syncope bleeding. 30 min on treadmill. Doing well.   His grandson Matthew [name], former [employer] baseball player, played minor leaks in the [place] [place] organization has finally moved on to potential coaching, recruiting.  Past Medical History:  Diagnosis Date  . Chronic venous insufficiency 02/06/2011  . Complete heart block (HCC) 07/20/2014   a. s/p STJ dual chamber PPM   . Family history of adverse reaction to anesthesia    "mother had carotid OR; they put her too deep; heart stopped in middle of operation; had to do CPR; broke a bunch of ribs; brother went too deep also; they had hard time gettin him out of it"  . Gilbert's syndrome   . Macular degeneration   . Paroxysmal atrial fibrillation (HCC) 07/20/2014  . Rosacea   . Thrombocytopenia (HCC)    Mild per Dr. Arbutus Ped  . Varicose veins of lower extremities with other complications 02/06/2011    Past Surgical History:  Procedure Laterality Date  . CARDIOVERSION  08/07/2011   Procedure: CARDIOVERSION;  Surgeon: Donato Schultz, MD;  Location: [facility] OR;  Service: Cardiovascular;  Laterality: N/A;  . ENDOVENOUS ABLATION SAPHENOUS VEIN W/ LASER Left 06-22-11   greater saphenous vein   . EP IMPLANTABLE DEVICE N/A 07/24/2014   STJ dual chamber PPM implanted by Dr Johney Frame for SSS  . EP IMPLANTABLE DEVICE  N/A 07/25/2014   RA lead revision by Dr Ladona Ridgel  . KNEE ARTHROSCOPY Right   . TONSILLECTOMY    . VASECTOMY      Current Medications: Current Meds  Medication Sig  . acetaminophen (TYLENOL) 325 MG tablet Take 325 mg by mouth daily as needed (pain).  . fluticasone (FLONASE) 50 MCG/ACT nasal spray Place 1-2 sprays into both nostrils at bedtime as needed (congestion).   . furosemide (LASIX) 40 MG tablet Take 40 mg by mouth daily as needed for fluid.   . rivaroxaban (XARELTO) 20 MG TABS tablet Take 1 tablet (20 mg total) by mouth daily.  . sildenafil (VIAGRA) 100 MG tablet Take 100 mg by mouth daily as needed for erectile dysfunction.      Allergies:   Adhesive [tape] and Codeine   Social History   Socioeconomic History  . Marital status: Married    Spouse name: Not on file  . Number of children: Not on file  . Years of education: Not on file  . Highest education level: Not on file  Occupational History  . Not on file  Social Needs  . Financial resource strain: Not on file  . Food insecurity:    Worry: Not on file    Inability: Not on file  . Transportation needs:    Medical: Not on file    Non-medical: Not on file  Tobacco Use  . Smoking status: Never Smoker  .  Smokeless tobacco: Never Used  Substance and Sexual Activity  . Alcohol use: Yes    Comment: 07/24/2014 "might have 3 beers/month"  . Drug use: No  . Sexual activity: Yes  Lifestyle  . Physical activity:    Days per week: Not on file    Minutes per session: Not on file  . Stress: Not on file  Relationships  . Social connections:    Talks on phone: Not on file    Gets together: Not on file    Attends religious service: Not on file    Active member of club or organization: Not on file    Attends meetings of clubs or organizations: Not on file    Relationship status: Not on file  Other Topics Concern  . Not on file  Social History Narrative  . Not on file     Family History: The patient's family history  includes Cancer in his brother and mother; Diabetes in his brother; Heart attack in his brother; Heart disease in his brother; Hypertension in his brother. There is no history of Stroke.  ROS:   Please see the history of present illness.     All other systems reviewed and are negative.  EKGs/Labs/Other Studies Reviewed:    The following studies were reviewed today: Prior office notes, EKG, pacing interrogation  ECHO 07/24/14: - Left ventricle: The cavity size was normal. Wall thickness was   increased in a pattern of mild LVH. Systolic function was normal.   The estimated ejection fraction was in the range of 60% to 65%.   Wall motion was normal; there were no regional wall motion   abnormalities. The study is not technically sufficient to allow   evaluation of LV diastolic function. - Aortic valve: There was trivial regurgitation. - Mitral valve: There was moderate regurgitation. - Left atrium: The atrium was mildly dilated. - Right atrium: The atrium was moderately to severely dilated. - Tricuspid valve: There was mild-moderate regurgitation. - Pulmonary arteries: PA peak pressure: 42 mm Hg (S).  EKG:  EKG is  ordered today.  The ekg ordered today demonstrates 10/19/2017- heart rate 60 sinus rhythm, appears to be P waves in lead II preceding each QRS complex.  Left bundle branch block appearance, likely AV pacing.  Personally reviewed and interpreted.  Recent Labs: No results found for requested labs within last 8760 hours.  Recent Lipid Panel No results found for: CHOL, TRIG, HDL, CHOLHDL, VLDL, LDLCALC, LDLDIRECT  Physical Exam:    VS:  BP 104/72   Pulse 60   Ht 5\' 11"  (1.803 m)   Wt 178 lb 12.8 oz (81.1 kg)   SpO2 98%   BMI 24.94 kg/m     Wt Readings from Last 3 Encounters:  10/19/17 178 lb 12.8 oz (81.1 kg)  07/05/17 184 lb (83.5 kg)  11/11/16 184 lb (83.5 kg)     GEN:  Well nourished, well developed in no acute distress HEENT: Normal NECK: No JVD; No carotid  bruits LYMPHATICS: No lymphadenopathy CARDIAC: RRR, no murmurs, rubs, gallops RESPIRATORY:  Clear to auscultation without rales, wheezing or rhonchi  ABDOMEN: Soft, non-tender, non-distended MUSCULOSKELETAL:  No edema; No deformity  SKIN: Warm and dry NEUROLOGIC:  Alert and oriented x 3 PSYCHIATRIC:  Normal affect   ASSESSMENT:    1. Sick sinus syndrome (HCC)   2. Paroxysmal atrial fibrillation (HCC)   3. Complete heart block (HCC)   4. Chronic anticoagulation    PLAN:    In order of  problems listed above:  Complete heart block/sick sinus syndrome -Saint Jude dual-chamber pacemaker 2016.  Office visit with EP reviewed.  Functioning normally.  At implant time needed lead revision.  Atrial fibrillation/flutter -Prior 41 conduction flutter, Xarelto, Dr. Pete Glatter has been checking blood work.  Stable.  Understands bleeding risks. -CHADSVASc score of 2 - Creatinine 1.3, hemoglobin 13.2  Continue with excellent exercise.  Doing well.   Medication Adjustments/Labs and Tests Ordered: Current medicines are reviewed at length with the patient today.  Concerns regarding medicines are outlined above.  Orders Placed This Encounter  Procedures  . EKG 12-Lead   No orders of the defined types were placed in this encounter.   Patient Instructions  Medication Instructions:  The current medical regimen is effective;  continue present plan and medications.  Follow-Up: Follow up in 1 year with Dr. Anne Fu.  You will receive a letter in the mail 2 months before you are due.  Please call us when you receive this letter to schedule your follow up appointment.  If you need a refill on your cardiac medications before your next appointment, please call your pharmacy.  Thank you for choosing [facility]!!        Signed, Donato Schultz, MD  10/19/2017 8:57 AM    [facility]

## 2017-10-19 NOTE — Patient Instructions (Signed)

## 2017-11-01 ENCOUNTER — Ambulatory Visit (INDEPENDENT_AMBULATORY_CARE_PROVIDER_SITE_OTHER): Payer: Medicare Other | Admitting: *Deleted

## 2017-11-01 DIAGNOSIS — I495 Sick sinus syndrome: Secondary | ICD-10-CM

## 2017-11-01 NOTE — Progress Notes (Signed)
Remote pacemaker transmission.   

## 2017-11-04 ENCOUNTER — Encounter: Payer: Self-pay | Admitting: Cardiology

## 2017-11-11 DIAGNOSIS — Z23 Encounter for immunization: Secondary | ICD-10-CM | POA: Diagnosis not present

## 2017-11-15 ENCOUNTER — Other Ambulatory Visit: Payer: Self-pay | Admitting: Cardiology

## 2017-11-15 NOTE — Telephone Encounter (Signed)
Xarelto 20mg  refill request received; pt is 84 yrs old, wt-81.1kg, Crea-1.31 on 05/24/17 via [facility], last seen by Dr. Anne Fu on 10/19/17, CrCl-48.28ml/min; per dosing criteria pt is on the incorrect dose, will send a msg to Dr. Anne Fu regarding this.

## 2017-11-16 MED ORDER — RIVAROXABAN 15 MG PO TABS: 15.0000 mg | ORAL_TABLET | Freq: Every day | ORAL | 2 refills | Status: DC

## 2017-11-16 NOTE — Telephone Encounter (Signed)
Will send in Xarelto 15mg  per Dr. Anne Fu staff message on today. Called the pt to update him, too, and he verbalized understanding.

## 2017-11-16 NOTE — Telephone Encounter (Signed)
-----   Message from Jake Bathe, MD sent at 11/16/2017  7:13 AM EDT ----- Change Xarelto to 15mg  PO QD  Thanks Donato Schultz, MD  ----- Message ----- From: Kandace Parkins, RN Sent: 11/15/2017   4:08 PM EDT To: Jake Bathe, MD  Good afternoon,  Pt has requested a Xarelto 20mg  refill and per dosing criteria the pt is on the incorrect dose. The pt is 84 yrs old, wt-81.1kg, Crea-1.31 on 05/24/17 via [facility], CrCl-48.15ml/min. Please advise.  Thanks,  Pamala Hurry, RN

## 2017-12-01 LAB — CUP PACEART REMOTE DEVICE CHECK
Battery Remaining Longevity: 119 mo
Battery Remaining Percentage: 95.5 %
Brady Statistic AP VS Percent: 1 %
Brady Statistic AS VS Percent: 1 %
Brady Statistic RV Percent Paced: 99 %
Implantable Lead Implant Date: 20160705
Implantable Lead Location: 753860
Implantable Pulse Generator Implant Date: 20160705
Lead Channel Impedance Value: 360 Ohm
Lead Channel Impedance Value: 560 Ohm
Lead Channel Pacing Threshold Amplitude: 0.5 V
Lead Channel Pacing Threshold Pulse Width: 0.4 ms
Lead Channel Sensing Intrinsic Amplitude: 0.6 mV
Lead Channel Setting Pacing Amplitude: 0.75 V
Lead Channel Setting Pacing Pulse Width: 0.4 ms
MDC IDC LEAD IMPLANT DT: 20160705
MDC IDC LEAD LOCATION: 753859
MDC IDC MSMT BATTERY VOLTAGE: 2.98 V
MDC IDC MSMT LEADCHNL RA PACING THRESHOLD AMPLITUDE: 0.625 V
MDC IDC MSMT LEADCHNL RA PACING THRESHOLD PULSEWIDTH: 0.5 ms
MDC IDC MSMT LEADCHNL RV SENSING INTR AMPL: 12 mV
MDC IDC SESS DTM: 20191014060023
MDC IDC SET LEADCHNL RA PACING AMPLITUDE: 1.625
MDC IDC SET LEADCHNL RV SENSING SENSITIVITY: 2 mV
MDC IDC STAT BRADY AP VP PERCENT: 97 %
MDC IDC STAT BRADY AS VP PERCENT: 2.6 %
MDC IDC STAT BRADY RA PERCENT PACED: 94 %
Pulse Gen Serial Number: 7779900

## 2017-12-06 ENCOUNTER — Telehealth: Payer: Self-pay | Admitting: Cardiology

## 2017-12-06 MED ORDER — FUROSEMIDE 40 MG PO TABS: 40.0000 mg | ORAL_TABLET | Freq: Every day | ORAL | 1 refills | Status: DC | PRN

## 2017-12-06 NOTE — Telephone Encounter (Signed)
Pt called to report that he has been having elevated BP and HR since last night. He denies headache, dizziness, SOB, chest pain.. But reports that he has been having increased swelling in his ankles because he has been out of his PRN Lasix. His readings are... Last night 10:30pm   149/108 HR 118 This morning 6am   153/93  HR70 Today   11am   153/113 HR 119  He says that his batteries are new in his cuff and he was at rest for all of these BP readings... I advised the pt if he develops any symptoms to please call me back or to call EMS.. Otherwise, I will forward his message to Dr. Pernell DupreSkains/ Pam for his advice and recommendations.

## 2017-12-06 NOTE — Telephone Encounter (Signed)
New message:        Pt c/o BP issue: STAT if pt c/o blurred vision, one-sided weakness or slurred speech  1. What are your last 5 BP readings? 153/113  2. Are you having any other symptoms (ex. Dizziness, headache, blurred vision, passed out)? None   3. What is your BP issue? Pt is calling and states he has never had any issues with his BP being high. Pt states he has a little swelling in his ankles.

## 2017-12-06 NOTE — Telephone Encounter (Signed)
Spoke with patient who reports feeling "good".   He denies any s/s except for some swelling at his ankles.  He has been out of Furosemide.  Refill to be sent into the pharmacy. He will continue to monitor his VS today and call back or to EMD if necessary.  Scheduled appt with Dr Anne FuSkains for 11/19.

## 2017-12-07 ENCOUNTER — Ambulatory Visit: Payer: Medicare Other | Admitting: Cardiology

## 2017-12-07 ENCOUNTER — Encounter: Payer: Self-pay | Admitting: Cardiology

## 2017-12-07 VITALS — BP 114/74 | HR 70 | Ht 71.0 in | Wt 187.2 lb

## 2017-12-07 DIAGNOSIS — I442 Atrioventricular block, complete: Secondary | ICD-10-CM | POA: Diagnosis not present

## 2017-12-07 DIAGNOSIS — I10 Essential (primary) hypertension: Secondary | ICD-10-CM | POA: Diagnosis not present

## 2017-12-07 NOTE — Patient Instructions (Signed)
  Medication Instructions:  The current medical regimen is effective;  continue present plan and medications.  If you need a refill on your cardiac medications before your next appointment, please call your pharmacy.   Follow-Up: At [facility], you and your health needs are our priority.  As part of our continuing mission to provide you with exceptional heart care, we have created designated Provider Care Teams.  These Care Teams include your primary Cardiologist (physician) and Advanced Practice Providers (APPs -  Physician Assistants and Nurse Practitioners) who all work together to provide you with the care you need, when you need it. You will need a follow up appointment in 6 months.  Please call our office 2 months in advance to schedule this appointment.  You may see Dr Skains or one of the following Advanced Practice Providers on your designated Care Team:   Lori Gerhardt, NP Laura Ingold, NP . Jill McDaniel, NP  Thank you for choosing [facility]!!       

## 2017-12-07 NOTE — Progress Notes (Signed)
Cardiology Office Note:    Date:  12/07/2017   ID:  [name], DOB Jun 11, 1931, MRN [id number]  PCP:  Merlene Laughter, MD  Cardiologist:  No primary care provider on file.  Electrophysiologist:  None   Referring MD: Merlene Laughter, MD     History of Present Illness:    Matthew [name] is a 84 y.o. male here for follow-up of recent episode of hypertension, pacemaker, atrial flutter.  Saint Jude dual-chamber pacemaker implanted in 2016 for sick sinus syndrome.   Atrial flutter at one point showed 4-1 conduction.   Had significant bradycardia prior to his pacemaker with heart rate in the 30s.  Instant relief.  Ventricular pacing previously 83%.  30 min on treadmill. Doing well.   His grandson Bebe Shaggy, former [employer] baseball player, played minor league in the [employer] [place] organization has finally moved on to potential coaching, recruiting.  12/07/2017- recent increase blood pressure.  Asymptomatic.  No chest pain fevers chills nausea vomiting syncope.  Increase salt load.  He was out of his furosemide.  Lower extremity edema tight.  Improved after starting his Lasix his blood pressure is back to normal.  Past Medical History:  Diagnosis Date  . Chronic venous insufficiency 02/06/2011  . Complete heart block (HCC) 07/20/2014   a. s/p STJ dual chamber PPM   . Family history of adverse reaction to anesthesia    "mother had carotid OR; they put her too deep; heart stopped in middle of operation; had to do CPR; broke a bunch of ribs; brother went too deep also; they had hard time gettin him out of it"  . Gilbert's syndrome   . Macular degeneration   . Paroxysmal atrial fibrillation (HCC) 07/20/2014  . Rosacea   . Thrombocytopenia (HCC)    Mild per Dr. Arbutus Ped  . Varicose veins of lower extremities with other complications 02/06/2011    Past Surgical History:  Procedure Laterality Date  . CARDIOVERSION  08/07/2011   Procedure: CARDIOVERSION;  Surgeon: Donato Schultz, MD;  Location: [facility] OR;   Service: Cardiovascular;  Laterality: N/A;  . ENDOVENOUS ABLATION SAPHENOUS VEIN W/ LASER Left 06-22-11   greater saphenous vein   . EP IMPLANTABLE DEVICE N/A 07/24/2014   STJ dual chamber PPM implanted by Dr Johney Frame for SSS  . EP IMPLANTABLE DEVICE N/A 07/25/2014   RA lead revision by Dr Ladona Ridgel  . KNEE ARTHROSCOPY Right   . TONSILLECTOMY    . VASECTOMY      Current Medications: Current Meds  Medication Sig  . acetaminophen (TYLENOL) 325 MG tablet Take 325 mg by mouth daily as needed (pain).  . fluticasone (FLONASE) 50 MCG/ACT nasal spray Place 1-2 sprays into both nostrils at bedtime as needed (congestion).   . furosemide (LASIX) 40 MG tablet Take 1 tablet (40 mg total) by mouth daily as needed for fluid.  . Rivaroxaban (XARELTO) 15 MG TABS tablet Take 1 tablet (15 mg total) by mouth daily with supper.  . sildenafil (VIAGRA) 100 MG tablet Take 100 mg by mouth daily as needed for erectile dysfunction.      Allergies:   Adhesive [tape] and Codeine   Social History   Socioeconomic History  . Marital status: Married    Spouse name: Not on file  . Number of children: Not on file  . Years of education: Not on file  . Highest education level: Not on file  Occupational History  . Not on file  Social Needs  . Financial resource strain: Not  on file  . Food insecurity:    Worry: Not on file    Inability: Not on file  . Transportation needs:    Medical: Not on file    Non-medical: Not on file  Tobacco Use  . Smoking status: Never Smoker  . Smokeless tobacco: Never Used  Substance and Sexual Activity  . Alcohol use: Yes    Comment: 07/24/2014 "might have 3 beers/month"  . Drug use: No  . Sexual activity: Yes  Lifestyle  . Physical activity:    Days per week: Not on file    Minutes per session: Not on file  . Stress: Not on file  Relationships  . Social connections:    Talks on phone: Not on file    Gets together: Not on file    Attends religious service: Not on file    Active  member of club or organization: Not on file    Attends meetings of clubs or organizations: Not on file    Relationship status: Not on file  Other Topics Concern  . Not on file  Social History Narrative  . Not on file     Family History: The patient's family history includes Cancer in his brother and mother; Diabetes in his brother; Heart attack in his brother; Heart disease in his brother; Hypertension in his brother. There is no history of Stroke.  ROS:   Please see the history of present illness.     All other systems reviewed and are negative.  EKGs/Labs/Other Studies Reviewed:    The following studies were reviewed today: Prior office notes, EKG, pacing interrogation  ECHO 07/24/14: - Left ventricle: The cavity size was normal. Wall thickness was   increased in a pattern of mild LVH. Systolic function was normal.   The estimated ejection fraction was in the range of 60% to 65%.   Wall motion was normal; there were no regional wall motion   abnormalities. The study is not technically sufficient to allow   evaluation of LV diastolic function. - Aortic valve: There was trivial regurgitation. - Mitral valve: There was moderate regurgitation. - Left atrium: The atrium was mildly dilated. - Right atrium: The atrium was moderately to severely dilated. - Tricuspid valve: There was mild-moderate regurgitation. - Pulmonary arteries: PA peak pressure: 42 mm Hg (S).  EKG:  EKG is  ordered today.  The ekg ordered today demonstrates 10/19/2017- heart rate 60 sinus rhythm, appears to be P waves in lead II preceding each QRS complex.  Left bundle branch block appearance, likely AV pacing.  Personally reviewed and interpreted.  Recent Labs: No results found for requested labs within last 8760 hours.  Recent Lipid Panel No results found for: CHOL, TRIG, HDL, CHOLHDL, VLDL, LDLCALC, LDLDIRECT  Physical Exam:    VS:  BP 114/74   Pulse 70   Ht 5\' 11"  (1.803 m)   Wt 187 lb 3.2 oz (84.9 kg)    SpO2 98%   BMI 26.11 kg/m     Wt Readings from Last 3 Encounters:  12/07/17 187 lb 3.2 oz (84.9 kg)  10/19/17 178 lb 12.8 oz (81.1 kg)  07/05/17 184 lb (83.5 kg)     GEN:  Well nourished, well developed in no acute distress HEENT: Normal NECK: No JVD; No carotid bruits LYMPHATICS: No lymphadenopathy CARDIAC: RRR, no murmurs, rubs, gallops RESPIRATORY:  Clear to auscultation without rales, wheezing or rhonchi  ABDOMEN: Soft, non-tender, non-distended MUSCULOSKELETAL: 2+ lower extremity bilateral edema; No deformity  SKIN: Warm  and dry NEUROLOGIC:  Alert and oriented x 3 PSYCHIATRIC:  Normal affect   ASSESSMENT:    1. Essential hypertension   2. Complete heart block (HCC)    PLAN:    In order of problems listed above:  Complete heart block/sick sinus syndrome -Saint Jude dual-chamber pacemaker 2016.  Office visit with EP reviewed.  Functioning normally.  At implant time needed lead revision.  Overall been stable.  No changes made.  Atrial fibrillation/flutter -Prior 4:1conduction flutter, Xarelto, Dr. Pete GlatterStoneking has been checking blood work.  Stable.  Understands bleeding risks. -CHADSVASc score of 2 - Creatinine 1.3, hemoglobin 13.2  Continue with excellent exercise.  Doing well.  Essential hypertension - He had a spike in his blood pressure in the 150s systolic.  He was worried about this.  This was likely secondary to an increased salt load with popcorn at the basketball game, salty peas, soup.  After getting his Lasix, this improved.  He did have increased tightness in his lower extremity edema.   Medication Adjustments/Labs and Tests Ordered: Current medicines are reviewed at length with the patient today.  Concerns regarding medicines are outlined above.  No orders of the defined types were placed in this encounter.  No orders of the defined types were placed in this encounter.   Patient Instructions  Medication Instructions:  The current medical regimen is  effective;  continue present plan and medications.  If you need a refill on your cardiac medications before your next appointment, please call your pharmacy.   Follow-Up: At [facility], you and your health needs are our priority.  As part of our continuing mission to provide you with exceptional heart care, we have created designated Provider Care Teams.  These Care Teams include your primary Cardiologist (physician) and Advanced Practice Providers (APPs -  Physician Assistants and Nurse Practitioners) who all work together to provide you with the care you need, when you need it. You will need a follow up appointment in 6 months.  Please call our office 2 months in advance to schedule this appointment.  You may see Dr Anne FuSkains or one of the following Advanced Practice Providers on your designated Care Team:   Norma FredricksonLori Gerhardt, NP Nada BoozerLaura Ingold, NP . Georgie ChardJill McDaniel, NP  Thank you for choosing [facility]!!         Signed, Donato SchultzMark Skains, MD  12/07/2017 4:17 PM    [facility]

## 2018-01-31 ENCOUNTER — Ambulatory Visit (INDEPENDENT_AMBULATORY_CARE_PROVIDER_SITE_OTHER): Payer: Medicare Other

## 2018-01-31 DIAGNOSIS — I495 Sick sinus syndrome: Secondary | ICD-10-CM | POA: Diagnosis not present

## 2018-01-31 DIAGNOSIS — I442 Atrioventricular block, complete: Secondary | ICD-10-CM

## 2018-01-31 NOTE — Progress Notes (Signed)
Remote pacemaker transmission.   

## 2018-02-02 LAB — CUP PACEART REMOTE DEVICE CHECK
Battery Voltage: 2.98 V
Brady Statistic AP VP Percent: 74 %
Brady Statistic AS VP Percent: 26 %
Brady Statistic AS VS Percent: 1 %
Brady Statistic RV Percent Paced: 99 %
Implantable Lead Implant Date: 20160705
Implantable Lead Location: 753860
Implantable Lead Model: 1948
Lead Channel Impedance Value: 360 Ohm
Lead Channel Impedance Value: 560 Ohm
Lead Channel Pacing Threshold Amplitude: 0.375 V
Lead Channel Pacing Threshold Pulse Width: 0.4 ms
Lead Channel Pacing Threshold Pulse Width: 0.5 ms
Lead Channel Sensing Intrinsic Amplitude: 0.5 mV
MDC IDC LEAD IMPLANT DT: 20160705
MDC IDC LEAD LOCATION: 753859
MDC IDC MSMT BATTERY REMAINING LONGEVITY: 122 mo
MDC IDC MSMT BATTERY REMAINING PERCENTAGE: 95.5 %
MDC IDC MSMT LEADCHNL RA PACING THRESHOLD AMPLITUDE: 0.75 V
MDC IDC MSMT LEADCHNL RV SENSING INTR AMPL: 12 mV
MDC IDC PG IMPLANT DT: 20160705
MDC IDC SESS DTM: 20200113070015
MDC IDC SET LEADCHNL RA PACING AMPLITUDE: 1.75 V
MDC IDC SET LEADCHNL RV PACING AMPLITUDE: 0.625
MDC IDC SET LEADCHNL RV PACING PULSEWIDTH: 0.4 ms
MDC IDC SET LEADCHNL RV SENSING SENSITIVITY: 2 mV
MDC IDC STAT BRADY AP VS PERCENT: 1 %
MDC IDC STAT BRADY RA PERCENT PACED: 55 %
Pulse Gen Model: 2240
Pulse Gen Serial Number: 7779900

## 2018-05-02 ENCOUNTER — Other Ambulatory Visit: Payer: Self-pay

## 2018-05-02 ENCOUNTER — Ambulatory Visit (INDEPENDENT_AMBULATORY_CARE_PROVIDER_SITE_OTHER): Payer: Medicare Other | Admitting: *Deleted

## 2018-05-02 DIAGNOSIS — I495 Sick sinus syndrome: Secondary | ICD-10-CM

## 2018-05-02 DIAGNOSIS — I442 Atrioventricular block, complete: Secondary | ICD-10-CM

## 2018-05-02 LAB — CUP PACEART REMOTE DEVICE CHECK
Battery Remaining Longevity: 122 mo
Battery Remaining Percentage: 95.5 %
Battery Voltage: 2.98 V
Brady Statistic AP VP Percent: 58 %
Brady Statistic AP VS Percent: 1 %
Brady Statistic AS VP Percent: 42 %
Brady Statistic AS VS Percent: 1 %
Brady Statistic RA Percent Paced: 38 %
Brady Statistic RV Percent Paced: 99 %
Date Time Interrogation Session: 20200413060015
Implantable Lead Implant Date: 20160705
Implantable Lead Implant Date: 20160705
Implantable Lead Location: 753859
Implantable Lead Location: 753860
Implantable Lead Model: 1948
Implantable Pulse Generator Implant Date: 20160705
Lead Channel Impedance Value: 360 Ohm
Lead Channel Impedance Value: 550 Ohm
Lead Channel Pacing Threshold Amplitude: 0.5 V
Lead Channel Pacing Threshold Amplitude: 0.75 V
Lead Channel Pacing Threshold Pulse Width: 0.4 ms
Lead Channel Pacing Threshold Pulse Width: 0.5 ms
Lead Channel Sensing Intrinsic Amplitude: 0.5 mV
Lead Channel Sensing Intrinsic Amplitude: 12 mV
Lead Channel Setting Pacing Amplitude: 0.75 V
Lead Channel Setting Pacing Amplitude: 1.75 V
Lead Channel Setting Pacing Pulse Width: 0.4 ms
Lead Channel Setting Sensing Sensitivity: 2 mV
Pulse Gen Model: 2240
Pulse Gen Serial Number: 7779900

## 2018-05-05 ENCOUNTER — Telehealth: Payer: Self-pay

## 2018-05-05 NOTE — Telephone Encounter (Signed)
Forwarded to scheduler to set up yearly office visit in June.  No urgent appt needed.

## 2018-05-05 NOTE — Telephone Encounter (Signed)
-----   Message from Hillis Range, MD sent at 05/04/2018  9:35 PM EDT ----- Remote device check reviewed.   Device report notable for:  Increasing atrial flutter over the past few months noted.  Please call and check on symptoms.   Offer earlier follow-up with me with telehealth.

## 2018-05-05 NOTE — Telephone Encounter (Signed)
Spoke to the pt re: Dr. Jenel Lucks note from his device interrogation... pt reports that he has been asymptomatic... he says he has been feeling very well... upset he has not been able to go to the gym but otherwise he says he has no complaints.  Pt says that he wears a watch that monitors his HR and O2 but he has not been flagged by it to show that anything has been abnormal.  I advised him to be sure that he is taking all of his meds without interruption and will forward back to Dr. Johney Frame.. Pt says he does not feel need for televisit but will consent if Dr. Johney Frame would still like to schedule one.   Asked him to please call us if anything changes and we will get back to him if Dr. Johney Frame has any changes.   Pt agreed.

## 2018-05-09 NOTE — Progress Notes (Signed)
Remote pacemaker transmission.   

## 2018-06-06 ENCOUNTER — Ambulatory Visit (INDEPENDENT_AMBULATORY_CARE_PROVIDER_SITE_OTHER): Payer: Medicare Other | Admitting: Cardiology

## 2018-06-06 ENCOUNTER — Encounter: Payer: Self-pay | Admitting: Cardiology

## 2018-06-06 ENCOUNTER — Other Ambulatory Visit: Payer: Self-pay

## 2018-06-06 ENCOUNTER — Telehealth: Payer: Self-pay

## 2018-06-06 VITALS — HR 70 | Ht 71.0 in | Wt 180.0 lb

## 2018-06-06 DIAGNOSIS — I495 Sick sinus syndrome: Secondary | ICD-10-CM | POA: Diagnosis not present

## 2018-06-06 DIAGNOSIS — I10 Essential (primary) hypertension: Secondary | ICD-10-CM | POA: Diagnosis not present

## 2018-06-06 DIAGNOSIS — Z7901 Long term (current) use of anticoagulants: Secondary | ICD-10-CM | POA: Diagnosis not present

## 2018-06-06 DIAGNOSIS — I480 Paroxysmal atrial fibrillation: Secondary | ICD-10-CM

## 2018-06-06 NOTE — Telephone Encounter (Signed)
Virtual Visit Pre-Appointment Phone Call  "(Name), I am calling you today to discuss your upcoming appointment. We are currently trying to limit exposure to the virus that causes COVID-19 by seeing patients at home rather than in the office."  1. "What is the BEST phone number to call the day of the visit?" - include this in appointment notes  2. "Do you have or have access to (through a family member/friend) a smartphone with video capability that we can use for your visit?" a. If yes - list this number in appt notes as "cell" (if different from BEST phone #) and list the appointment type as a VIDEO visit in appointment notes b. If no - list the appointment type as a PHONE visit in appointment notes  3. Confirm consent - "In the setting of the current Covid19 crisis, you are scheduled for a (phone or video) visit with your provider on (date) at (time).  Just as we do with many in-office visits, in order for you to participate in this visit, we must obtain consent.  If you'd like, I can send this to your mychart (if signed up) or email for you to review.  Otherwise, I can obtain your verbal consent now.  All virtual visits are billed to your insurance company just like a normal visit would be.  By agreeing to a virtual visit, we'd like you to understand that the technology does not allow for your provider to perform an examination, and thus may limit your provider's ability to fully assess your condition. If your provider identifies any concerns that need to be evaluated in person, we will make arrangements to do so.  Finally, though the technology is pretty good, we cannot assure that it will always work on either your or our end, and in the setting of a video visit, we may have to convert it to a phone-only visit.  In either situation, we cannot ensure that we have a secure connection.  Are you willing to proceed?" STAFF: Did the patient verbally acknowledge consent to telehealth visit? Document  YES  4. Advise patient to be prepared - "Two hours prior to your appointment, go ahead and check your blood pressure, pulse, oxygen saturation, and your weight (if you have the equipment to check those) and write them all down. When your visit starts, your provider will ask you for this information. If you have an Apple Watch or Kardia device, please plan to have heart rate information ready on the day of your appointment. Please have a pen and paper handy nearby the day of the visit as well."  5. Give patient instructions for MyChart download to smartphone OR Doximity/Doxy.me as below if video visit (depending on what platform provider is using)  6. Inform patient they will receive a phone call 15 minutes prior to their appointment time (may be from unknown caller ID) so they should be prepared to answer    TELEPHONE CALL NOTE  Matthew MainsJohn N [name] has been deemed a candidate for a follow-up tele-health visit to limit community exposure during the Covid-19 pandemic. I spoke with the patient via phone to ensure availability of phone/video source, confirm preferred email & phone number, and discuss instructions and expectations.  I reminded Matthew [name] to be prepared with any vital sign and/or heart rhythm information that could potentially be obtained via home monitoring, at the time of his visit. I reminded Matthew MainsJohn N [name] to expect a phone call prior to his visit.  Matthew [name], [place] 06/06/2018 8:56 AM   INSTRUCTIONS FOR DOWNLOADING THE MYCHART APP TO SMARTPHONE  - The patient must first make sure to have activated MyChart and know their login information - If Apple, go to [employer] and type in MyChart in the search bar and download the app. If Android, ask patient to go to [employer] and type in [place] in the search bar and download the app. The app is free but as with any other app downloads, their phone may require them to verify saved payment information or Apple/Android password.  -  The patient will need to then log into the app with their MyChart username and password, and select [place] as their healthcare provider to link the account. When it is time for your visit, go to the MyChart app, find appointments, and click Begin Video Visit. Be sure to Select Allow for your device to access the Microphone and Camera for your visit. You will then be connected, and your provider will be with you shortly.  **If they have any issues connecting, or need assistance please contact MyChart service desk [phone])**  **If using a computer, in order to ensure the best quality for their visit they will need to use either of the following Internet Browsers: [employer] or Google Chrome**  IF USING DOXIMITY or DOXY.ME - The patient will receive a link just prior to their visit by text.     FULL LENGTH CONSENT FOR TELE-HEALTH VISIT   I hereby voluntarily request, consent and authorize [facility] and its employed or contracted physicians, physician assistants, nurse practitioners or other licensed health care professionals (the Practitioner), to provide me with telemedicine health care services (the "Services") as deemed necessary by the treating Practitioner. I acknowledge and consent to receive the Services by the Practitioner via telemedicine. I understand that the telemedicine visit will involve communicating with the Practitioner through live audiovisual communication technology and the disclosure of certain medical information by electronic transmission. I acknowledge that I have been given the opportunity to request an in-person assessment or other available alternative prior to the telemedicine visit and am voluntarily participating in the telemedicine visit.  I understand that I have the right to withhold or withdraw my consent to the use of telemedicine in the course of my care at any time, without affecting my right to future care or treatment, and that the  Practitioner or I may terminate the telemedicine visit at any time. I understand that I have the right to inspect all information obtained and/or recorded in the course of the telemedicine visit and may receive copies of available information for a reasonable fee.  I understand that some of the potential risks of receiving the Services via telemedicine include:  Marland Kitchen Delay or interruption in medical evaluation due to technological equipment failure or disruption; . Information transmitted may not be sufficient (e.g. poor resolution of images) to allow for appropriate medical decision making by the Practitioner; and/or  . In rare instances, security protocols could fail, causing a breach of personal health information.  Furthermore, I acknowledge that it is my responsibility to provide information about my medical history, conditions and care that is complete and accurate to the best of my ability. I acknowledge that Practitioner's advice, recommendations, and/or decision may be based on factors not within their control, such as incomplete or inaccurate data provided by me or distortions of diagnostic images or specimens that may result from electronic transmissions. I understand  that the practice of medicine is not an exact science and that Practitioner makes no warranties or guarantees regarding treatment outcomes. I acknowledge that I will receive a copy of this consent concurrently upon execution via email to the email address I last provided but may also request a printed copy by calling the office of [facility].    I understand that my insurance will be billed for this visit.   I have read or had this consent read to me. . I understand the contents of this consent, which adequately explains the benefits and risks of the Services being provided via telemedicine.  . I have been provided ample opportunity to ask questions regarding this consent and the Services and have had my questions answered to my  satisfaction. . I give my informed consent for the services to be provided through the use of telemedicine in my medical care  By participating in this telemedicine visit I agree to the above.

## 2018-06-06 NOTE — Progress Notes (Signed)
Virtual Visit via Telephone Note   This visit type was conducted due to national recommendations for restrictions regarding the COVID-19 Pandemic (e.g. social distancing) in an effort to limit this patient's exposure and mitigate transmission in our community.  Due to his co-morbid illnesses, this patient is at least at moderate risk for complications without adequate follow up.  This format is felt to be most appropriate for this patient at this time.  The patient did not have access to video technology/had technical difficulties with video requiring transitioning to audio format only (telephone).  All issues noted in this document were discussed and addressed.  No physical exam could be performed with this format.  Please refer to the patient's chart for his  consent to telehealth for [facility].   Date:  06/06/2018   ID:  Matthew [name], DOB 1931-06-18, MRN [id number]  Patient Location: Home Provider Location: Home  PCP:  Matthew [name]  Cardiologist:  Matthew SchultzMark Javaria Knapke, [name]  Electrophysiologist:  None   Evaluation Performed:  Follow-Up Visit  Chief Complaint: Atrial flutter follow-up  History of Present Illness:    Matthew [name] is a 85 y.o. male with pacemaker placement, atrial flutter, sick sinus syndrome follow-up.  Pacemaker was implanted, [employer] in 2016.  Had significant bradycardia with heart rates in the 30s.  States that after his pacemaker was placed, he felt instant relief.  He was pacing in approximately 83% at one point.  We always enjoyed talking about his grandson [name], former [employer] baseball player who played minor league in the [employer] organization and finally moved onto potential coaching/recruiting positions. AAA/AA, spring training went well, cut him. [place] [address], talent scout, Copywriter, advertisingdeveloper.  Pacemaker checks have been performed.  Excellent. Pulse ox check doing well.   BP not recently done, but at prior visit, excellent.  Overall  doing very well, no chest pain, no syncope, no bleeding.  Previously, Xarelto was decreased to 15 mg.  The patient does not have symptoms concerning for COVID-19 infection (fever, chills, cough, or new shortness of breath).    Past Medical History:  Diagnosis Date  . Chronic venous insufficiency 02/06/2011  . Complete heart block (HCC) 07/20/2014   a. s/p STJ dual chamber PPM   . Family history of adverse reaction to anesthesia    "mother had carotid OR; they put her too deep; heart stopped in middle of operation; had to do CPR; broke a bunch of ribs; brother went too deep also; they had hard time gettin him out of it"  . Gilbert's syndrome   . Macular degeneration   . Paroxysmal atrial fibrillation (HCC) 07/20/2014  . Rosacea   . Thrombocytopenia (HCC)    Mild per Dr. Arbutus PedMohamed  . Varicose veins of lower extremities with other complications 02/06/2011   Past Surgical History:  Procedure Laterality Date  . CARDIOVERSION  08/07/2011   Procedure: CARDIOVERSION;  Surgeon: Matthew SchultzMark Kahron Kauth, [name];  [facility];  Service: Cardiovascular;  Laterality: N/A;  . ENDOVENOUS ABLATION SAPHENOUS VEIN W/ LASER Left 06-22-11   greater saphenous vein   . EP IMPLANTABLE DEVICE N/A 07/24/2014   STJ dual chamber PPM implanted by Dr Matthew [name] for SSS  . EP IMPLANTABLE DEVICE N/A 07/25/2014   RA lead revision by Dr Matthew [name]  . KNEE ARTHROSCOPY Right   . TONSILLECTOMY    . VASECTOMY       No outpatient medications have been marked as taking for the 06/06/18 encounter (  Office Visit) with Matthew Bathe, [name].     Allergies:   Adhesive [tape] and Codeine   Social History   Tobacco Use  . Smoking status: Never Smoker  . Smokeless tobacco: Never Used  Substance Use Topics  . Alcohol use: Yes    Comment: 07/24/2014 "might have 3 beers/month"  . Drug use: No     Family Hx: The patient's family history includes Cancer in his brother and mother; Diabetes in his brother; Heart attack in his brother; Heart disease in his  brother; Hypertension in his brother. There is no history of Stroke.  ROS:   Please see the history of present illness.    Denies any fevers chills nausea vomiting syncope bleeding All other systems reviewed and are negative.   Prior CV studies:   The following studies were reviewed today:  ECHO 07/24/14: - Left ventricle: The cavity size was normal. Wall thickness was   increased in a pattern of mild LVH. Systolic function was normal.   The estimated ejection fraction was in the range of 60% to 65%.   Wall motion was normal; there were no regional wall motion   abnormalities. The study is not technically sufficient to allow   evaluation of LV diastolic function. - Aortic valve: There was trivial regurgitation. - Mitral valve: There was moderate regurgitation. - Left atrium: The atrium was mildly dilated. - Right atrium: The atrium was moderately to severely dilated. - Tricuspid valve: There was mild-moderate regurgitation. - Pulmonary arteries: PA peak pressure: 42 mm Hg (S).  Labs/Other Tests and Data Reviewed:    EKG:  An ECG dated 10/19/17 was personally reviewed today and demonstrated:  Sinus rhythm/P waves noted, ventricular pacing  Recent Labs: No results found for requested labs within last 8760 hours.   Recent Lipid Panel No results found for: CHOL, TRIG, HDL, CHOLHDL, LDLCALC, LDLDIRECT  Wt Readings from Last 3 Encounters:  06/06/18 180 lb (81.6 kg)  12/07/17 187 lb 3.2 oz (84.9 kg)  10/19/17 178 lb 12.8 oz (81.1 kg)     Objective:    Vital Signs:  Pulse 70   Ht 5\' 11"  (1.803 m)   Wt 180 lb (81.6 kg)   SpO2 99%   BMI 25.10 kg/m    VITAL SIGNS:  reviewed  Talking comfortably on phone, normal respiratory effort, alert  ASSESSMENT & PLAN:    Sick sinus syndrome/complete heart block - Pacemaker 2016, [employer] Jude.  Doing well.  EP has been following. - After initial implant, lead revision was needed.  Overall has been stable since then.  Atrial  fibrillation/flutter - Previously it was noted that he had 4-1 atrial conduction in the setting of flutter.  Xarelto 15mg  has been utilized for anticoagulation.  Chads vas score is 3 for age and hypertension.  Doing well.  Dr. Pete Glatter has been periodically checking his blood work.  Last hemoglobin and creatinine stable.  Essential hypertension -Overall been doing quite well.  At one point had elevated blood pressures that he felt was related to increased salt load.  Took a few days of Lasix, this improved. Has not needed any recently.  COVID-19 Education: The signs and symptoms of COVID-19 were discussed with the patient and how to seek care for testing (follow up with PCP or arrange E-visit).  The importance of social distancing was discussed today.  Time:   Today, I have spent 18 minutes with the patient with telehealth technology discussing the above problems.     Medication  Adjustments/Labs and Tests Ordered: Current medicines are reviewed at length with the patient today.  Concerns regarding medicines are outlined above.   Tests Ordered: No orders of the defined types were placed in this encounter.   Medication Changes: No orders of the defined types were placed in this encounter.   Disposition:  Follow up in 6 month(s)  Signed, Matthew Schultz, [name]  06/06/2018 10:28 AM    [facility]

## 2018-06-06 NOTE — Patient Instructions (Signed)
Medication Instructions:  The current medical regimen is effective;  continue present plan and medications.  If you need a refill on your cardiac medications before your next appointment, please call your pharmacy.   Follow-Up: Follow up in 6 months with Dr. Skains.  You will receive a letter in the mail 2 months before you are due.  Please call us when you receive this letter to schedule your follow up appointment.   Thank you for choosing [facility]!!       

## 2018-07-06 ENCOUNTER — Telehealth: Payer: Self-pay

## 2018-07-07 ENCOUNTER — Telehealth (INDEPENDENT_AMBULATORY_CARE_PROVIDER_SITE_OTHER): Payer: Medicare Other | Admitting: Internal Medicine

## 2018-07-07 ENCOUNTER — Encounter: Payer: Self-pay | Admitting: Internal Medicine

## 2018-07-07 ENCOUNTER — Other Ambulatory Visit: Payer: Self-pay

## 2018-07-07 DIAGNOSIS — I484 Atypical atrial flutter: Secondary | ICD-10-CM | POA: Diagnosis not present

## 2018-07-07 DIAGNOSIS — I4819 Other persistent atrial fibrillation: Secondary | ICD-10-CM | POA: Diagnosis not present

## 2018-07-07 DIAGNOSIS — Z7901 Long term (current) use of anticoagulants: Secondary | ICD-10-CM

## 2018-07-07 DIAGNOSIS — R001 Bradycardia, unspecified: Secondary | ICD-10-CM | POA: Diagnosis not present

## 2018-07-07 NOTE — Progress Notes (Signed)
Electrophysiology TeleHealth Note  Due to national recommendations of social distancing due to [place] 19, an audio telehealth visit is felt to be most appropriate for this patient at this time.  Verbal consent was obtained by me for the telehealth visit today.  The patient does not have capability for a virtual visit.  A phone visit is therefore required today.   Date:  07/07/2018   ID:  Matthew [name], DOB October 20, 1931, MRN [id number]  Location: patient's home  Provider location:  [facility]  Evaluation Performed: Follow-up visit  PCP:  Lajean Manes, MD   Electrophysiologist:  Dr Rayann Heman  Chief Complaint:  palpitations  History of Present Illness:    Matthew [name] is a 85 y.o. male who presents via telehealth conferencing today.  Since last being seen in our clinic, the patient reports doing very well.  Today, he denies symptoms of palpitations, chest pain, shortness of breath,  lower extremity edema, dizziness, presyncope, or syncope.  The patient is otherwise without complaint today.  The patient denies symptoms of fevers, chills, cough, or new SOB worrisome for COVID 19.  Past Medical History:  Diagnosis Date  . Chronic venous insufficiency 02/06/2011  . Complete heart block ([place]) 07/20/2014   a. s/p STJ dual chamber PPM   . Family history of adverse reaction to anesthesia    "mother had carotid OR; they put her too deep; heart stopped in middle of operation; had to do CPR; broke a bunch of ribs; brother went too deep also; they had hard time gettin him out of it"  . Gilbert's syndrome   . Macular degeneration   . Paroxysmal atrial fibrillation ([place]) 07/20/2014  . Rosacea   . Thrombocytopenia (HCC)    Mild per Dr. Julien Nordmann  . Varicose veins of lower extremities with other complications 07/02/4313    Past Surgical History:  Procedure Laterality Date  . CARDIOVERSION  08/07/2011   Procedure: CARDIOVERSION;  Surgeon: Candee Furbish, MD;  Location: [facility];  Service: Cardiovascular;   Laterality: N/A;  . ENDOVENOUS ABLATION SAPHENOUS VEIN W/ LASER Left 06-22-11   greater saphenous vein   . EP IMPLANTABLE DEVICE N/A 07/24/2014   STJ dual chamber PPM implanted by Dr Rayann Heman for SSS  . EP IMPLANTABLE DEVICE N/A 07/25/2014   RA lead revision by Dr Lovena Le  . KNEE ARTHROSCOPY Right   . TONSILLECTOMY    . VASECTOMY      Current Outpatient Medications  Medication Sig Dispense Refill  . acetaminophen (TYLENOL) 500 MG tablet Take 500 mg by mouth as needed for mild pain.    . fluticasone (FLONASE) 50 MCG/ACT nasal spray Place 1-2 sprays into both nostrils at bedtime as needed (congestion).     . furosemide (LASIX) 40 MG tablet Take 1 tablet (40 mg total) by mouth daily as needed for fluid. 30 tablet 1  . Rivaroxaban (XARELTO) 15 MG TABS tablet Take 1 tablet (15 mg total) by mouth daily with supper. 90 tablet 2  . sildenafil (VIAGRA) 100 MG tablet Take 100 mg by mouth daily as needed for erectile dysfunction.      No current facility-administered medications for this visit.     Allergies:   Adhesive [tape] and Codeine   Social History:  The patient  reports that he has never smoked. He has never used smokeless tobacco. He reports current alcohol use. He reports that he does not use drugs.   Family History:  The patient's  family history includes Cancer in  his brother and mother; Diabetes in his brother; Heart attack in his brother; Heart disease in his brother; Hypertension in his brother.   ROS:  Please see the history of present illness.   All other systems are personally reviewed and negative.    Exam:    Vital Signs:  BP 129/77   Pulse 70   Ht 5\' 11"  (1.803 m)   Wt 180 lb (81.6 kg)   SpO2 98%   BMI 25.10 kg/m   Well sounding today   Labs/Other Tests and Data Reviewed:    Recent Labs: No results found for requested labs within last 8760 hours.   Wt Readings from Last 3 Encounters:  07/07/18 180 lb (81.6 kg)  06/06/18 180 lb (81.6 kg)  12/07/17 187 lb 3.2 oz  (84.9 kg)     Last device remote is reviewed from PaceART PDF which reveals normal device function,  Atrial flutter burden has increased.   ASSESSMENT & PLAN:    1.  Sinus bradycardia Normal device function by remotes Remotes are uptodate  2. Paroxysmal atrial fibrillation/ atrial flutter AF burden is 32 % Asymptomatic He is undersensing atrial arrhythmias due to atrial flutter CL.  I suspect that he may be persistently in AFlutter since October. chads2vasc score is 3.  He is on xarelto  Could consider cardioversion after COVID 19 has passed.  I will defer to Dr Anne FuSkains  3. HTN Stable No change required today  Follow-up:  12 months with me   Patient Risk:  after full review of this patients clinical status, I feel that they are at moderate risk at this time.  Today, I have spent 15 minutes with the patient with telehealth technology discussing arrhythmia management .    Randolm IdolSigned, Elza Sortor, MD  07/07/2018 3:04 PM     [facility] [address] [place] [address] [phone] (office) [phone] (fax)

## 2018-08-01 ENCOUNTER — Ambulatory Visit (INDEPENDENT_AMBULATORY_CARE_PROVIDER_SITE_OTHER): Payer: Medicare Other | Admitting: *Deleted

## 2018-08-01 DIAGNOSIS — I495 Sick sinus syndrome: Secondary | ICD-10-CM | POA: Diagnosis not present

## 2018-08-01 DIAGNOSIS — I442 Atrioventricular block, complete: Secondary | ICD-10-CM

## 2018-08-01 LAB — CUP PACEART REMOTE DEVICE CHECK
Battery Remaining Longevity: 122 mo
Battery Remaining Percentage: 95.5 %
Battery Voltage: 2.98 V
Brady Statistic AP VP Percent: 57 %
Brady Statistic AP VS Percent: 1 %
Brady Statistic AS VP Percent: 43 %
Brady Statistic AS VS Percent: 1 %
Brady Statistic RA Percent Paced: 30 %
Brady Statistic RV Percent Paced: 99 %
Date Time Interrogation Session: 20200713064155
Implantable Lead Implant Date: 20160705
Implantable Lead Implant Date: 20160705
Implantable Lead Location: 753859
Implantable Lead Location: 753860
Implantable Lead Model: 1948
Implantable Pulse Generator Implant Date: 20160705
Lead Channel Impedance Value: 380 Ohm
Lead Channel Impedance Value: 590 Ohm
Lead Channel Pacing Threshold Amplitude: 0.5 V
Lead Channel Pacing Threshold Amplitude: 0.75 V
Lead Channel Pacing Threshold Pulse Width: 0.4 ms
Lead Channel Pacing Threshold Pulse Width: 0.5 ms
Lead Channel Sensing Intrinsic Amplitude: 0.5 mV
Lead Channel Sensing Intrinsic Amplitude: 12 mV
Lead Channel Setting Pacing Amplitude: 0.75 V
Lead Channel Setting Pacing Amplitude: 1.75 V
Lead Channel Setting Pacing Pulse Width: 0.4 ms
Lead Channel Setting Sensing Sensitivity: 2 mV
Pulse Gen Model: 2240
Pulse Gen Serial Number: 7779900

## 2018-08-09 NOTE — Progress Notes (Signed)
Remote pacemaker transmission.   

## 2018-08-24 ENCOUNTER — Other Ambulatory Visit: Payer: Self-pay | Admitting: Cardiology

## 2018-08-24 ENCOUNTER — Other Ambulatory Visit: Payer: Self-pay | Admitting: *Deleted

## 2018-08-24 NOTE — Telephone Encounter (Signed)
Medication refill for Xarelto 15 mg [employer], [phone]. Thank you.

## 2018-08-24 NOTE — Telephone Encounter (Signed)
Last OV 07/07/2018 Patient overdue for labwork He has a physical with his PCP in October. Due to COVID 19 I will give encough refills until then 

## 2018-08-24 NOTE — Telephone Encounter (Signed)
Last OV 07/07/2018 Patient overdue for labwork He has a physical with his PCP in October. Due to COVID 19 I will give encough refills until then

## 2018-10-12 DIAGNOSIS — H5203 Hypermetropia, bilateral: Secondary | ICD-10-CM | POA: Diagnosis not present

## 2018-10-22 DIAGNOSIS — Z23 Encounter for immunization: Secondary | ICD-10-CM | POA: Diagnosis not present

## 2018-10-26 DIAGNOSIS — L565 Disseminated superficial actinic porokeratosis (DSAP): Secondary | ICD-10-CM | POA: Diagnosis not present

## 2018-10-26 DIAGNOSIS — Z85828 Personal history of other malignant neoplasm of skin: Secondary | ICD-10-CM | POA: Diagnosis not present

## 2018-10-26 DIAGNOSIS — D692 Other nonthrombocytopenic purpura: Secondary | ICD-10-CM | POA: Diagnosis not present

## 2018-10-26 DIAGNOSIS — C44329 Squamous cell carcinoma of skin of other parts of face: Secondary | ICD-10-CM | POA: Diagnosis not present

## 2018-11-01 ENCOUNTER — Ambulatory Visit (INDEPENDENT_AMBULATORY_CARE_PROVIDER_SITE_OTHER): Payer: Medicare Other | Admitting: *Deleted

## 2018-11-01 DIAGNOSIS — I495 Sick sinus syndrome: Secondary | ICD-10-CM

## 2018-11-01 DIAGNOSIS — I442 Atrioventricular block, complete: Secondary | ICD-10-CM

## 2018-11-01 LAB — CUP PACEART REMOTE DEVICE CHECK
Battery Remaining Longevity: 125 mo
Battery Remaining Percentage: 95.5 %
Battery Voltage: 2.98 V
Brady Statistic AP VP Percent: 55 %
Brady Statistic AP VS Percent: 1 %
Brady Statistic AS VP Percent: 45 %
Brady Statistic AS VS Percent: 1 %
Brady Statistic RA Percent Paced: 24 %
Brady Statistic RV Percent Paced: 99 %
Date Time Interrogation Session: 20201012060032
Implantable Lead Implant Date: 20160705
Implantable Lead Implant Date: 20160705
Implantable Lead Location: 753859
Implantable Lead Location: 753860
Implantable Lead Model: 1948
Implantable Pulse Generator Implant Date: 20160705
Lead Channel Impedance Value: 380 Ohm
Lead Channel Impedance Value: 560 Ohm
Lead Channel Pacing Threshold Amplitude: 0.375 V
Lead Channel Pacing Threshold Amplitude: 0.75 V
Lead Channel Pacing Threshold Pulse Width: 0.4 ms
Lead Channel Pacing Threshold Pulse Width: 0.5 ms
Lead Channel Sensing Intrinsic Amplitude: 0.7 mV
Lead Channel Sensing Intrinsic Amplitude: 12 mV
Lead Channel Setting Pacing Amplitude: 0.625
Lead Channel Setting Pacing Amplitude: 1.75 V
Lead Channel Setting Pacing Pulse Width: 0.4 ms
Lead Channel Setting Sensing Sensitivity: 2 mV
Pulse Gen Model: 2240
Pulse Gen Serial Number: 7779900

## 2018-11-08 ENCOUNTER — Telehealth: Payer: Self-pay | Admitting: *Deleted

## 2018-11-08 NOTE — Telephone Encounter (Signed)
Spoke with patient and wife. Patient denies any symptoms of SOB or chest discomfort, no awareness of his AF. He has not exercised since his gym closed in February, so he is unaware of any exercise intolerance. Performs ADLs without any issues. Patient reports he is "95%" compliant with Xarelto, occasionally misses a dose. Encouraged 100% compliance. Encouraged pt and wife to call back if he notices any of the above symptoms. Confirmed upcoming appointment with Dr. Marlou Porch on 12/12/18. Pt and wife verbalize understanding, deny any additional questions at this time.

## 2018-11-08 NOTE — Telephone Encounter (Signed)
-----   Message from Thompson Grayer, MD sent at 11/06/2018  4:31 PM EDT ----- Remote device check reviewed.   Device report notable for:  I previously discussed cardioversion in June.   Please follow-up with him to see if he would like to consider cardioversion or if he would prefer to continue his current strategy.  He is due to see Dr Marlou Porch in November.  This can be further addressed by Dr Marlou Porch at that time also.

## 2018-11-09 DIAGNOSIS — I480 Paroxysmal atrial fibrillation: Secondary | ICD-10-CM | POA: Diagnosis not present

## 2018-11-09 DIAGNOSIS — N1831 Chronic kidney disease, stage 3a: Secondary | ICD-10-CM | POA: Diagnosis not present

## 2018-11-09 DIAGNOSIS — D696 Thrombocytopenia, unspecified: Secondary | ICD-10-CM | POA: Diagnosis not present

## 2018-11-09 DIAGNOSIS — Z0000 Encounter for general adult medical examination without abnormal findings: Secondary | ICD-10-CM | POA: Diagnosis not present

## 2018-11-10 DIAGNOSIS — D649 Anemia, unspecified: Secondary | ICD-10-CM | POA: Diagnosis not present

## 2018-11-10 NOTE — Progress Notes (Signed)
Remote pacemaker transmission.   

## 2018-12-01 ENCOUNTER — Other Ambulatory Visit: Payer: Self-pay | Admitting: Cardiology

## 2018-12-01 MED ORDER — RIVAROXABAN 15 MG PO TABS: ORAL_TABLET | ORAL | 1 refills | Status: DC

## 2018-12-01 NOTE — Telephone Encounter (Signed)
°*  STAT* If patient is at the pharmacy, call can be transferred to refill team.   1. Which medications need to be refilled? (please list name of each medication and dose if known) XARELTO 15 MG TABS tablet  2. Which pharmacy/location (including street and city if local pharmacy) is medication to be sent to? [place], [place].  3. Do they need a 30 day or 90 day supply? [place]

## 2018-12-01 NOTE — Telephone Encounter (Signed)
Last OV 5/18 Scr 1.3 on 11/09/2018 per [facility] Crcl=47.95

## 2018-12-12 ENCOUNTER — Ambulatory Visit: Payer: Medicare Other | Admitting: Cardiology

## 2018-12-12 ENCOUNTER — Encounter (INDEPENDENT_AMBULATORY_CARE_PROVIDER_SITE_OTHER): Payer: Self-pay

## 2018-12-12 ENCOUNTER — Encounter: Payer: Self-pay | Admitting: Cardiology

## 2018-12-12 ENCOUNTER — Other Ambulatory Visit: Payer: Self-pay

## 2018-12-12 VITALS — BP 90/50 | HR 73 | Ht 71.0 in | Wt 175.8 lb

## 2018-12-12 DIAGNOSIS — Z7901 Long term (current) use of anticoagulants: Secondary | ICD-10-CM

## 2018-12-12 DIAGNOSIS — I442 Atrioventricular block, complete: Secondary | ICD-10-CM | POA: Diagnosis not present

## 2018-12-12 DIAGNOSIS — I10 Essential (primary) hypertension: Secondary | ICD-10-CM | POA: Diagnosis not present

## 2018-12-12 DIAGNOSIS — I484 Atypical atrial flutter: Secondary | ICD-10-CM | POA: Diagnosis not present

## 2018-12-12 NOTE — Progress Notes (Signed)
Cardiology Office Note:    Date:  12/12/2018   ID:  Matthew MainsJohn N Cassata, DOB January 15, 1932, MRN [id number]  PCP:  Merlene LaughterStoneking, Hal, MD  Cardiologist:  Donato SchultzMark Seva Chancy, MD  Electrophysiologist:  None   Referring MD: Merlene LaughterStoneking, Hal, MD     History of Present Illness:    Matthew [name] is a 85 y.o. male here for follow-up of pacemaker atrial flutter sick sinus syndrome.  Pacemaker was implanted, [employer] in 2016.  Had significant bradycardia with heart rates in the 30s.  States that after his pacemaker was placed, he felt instant relief.  He was pacing in approximately 83% at one point.  We always enjoyed talking about his grandson [name], former [employer] baseball player who played minor league in the [place] organization and finally moved onto potential coaching/recruiting positions. AAA/AA, spring training went well, cut him. [place] few weeks, [address], [place], talent scout, Copywriter, advertisingdeveloper. Married 11/2018.   Pacemaker checks have been performed.  Excellent. Pulse ox check doing well. Previously, Xarelto was decreased to 15 mg.  BP at home 115-135 SBP.  Here today was 90/50.  He is asymptomatic.  No bleeding fevers chills nausea vomiting syncope   Past Medical History:  Diagnosis Date  . Chronic venous insufficiency 02/06/2011  . Complete heart block (HCC) 07/20/2014   a. s/p STJ dual chamber PPM   . Family history of adverse reaction to anesthesia    "mother had carotid OR; they put her too deep; heart stopped in middle of operation; had to do CPR; broke a bunch of ribs; brother went too deep also; they had hard time gettin him out of it"  . Gilbert's syndrome   . Macular degeneration   . Paroxysmal atrial fibrillation (HCC) 07/20/2014  . Rosacea   . Thrombocytopenia (HCC)    Mild per Dr. Arbutus PedMohamed  . Varicose veins of lower extremities with other complications 02/06/2011    Past Surgical History:  Procedure Laterality Date  . CARDIOVERSION  08/07/2011   Procedure: CARDIOVERSION;  Surgeon:  Donato SchultzMark Koa Palla, MD;  [facility];  Service: Cardiovascular;  Laterality: N/A;  . ENDOVENOUS ABLATION SAPHENOUS VEIN W/ LASER Left 06-22-11   greater saphenous vein   . EP IMPLANTABLE DEVICE N/A 07/24/2014   STJ dual chamber PPM implanted by Dr Johney FrameAllred for SSS  . EP IMPLANTABLE DEVICE N/A 07/25/2014   RA lead revision by Dr Ladona Ridgelaylor  . KNEE ARTHROSCOPY Right   . TONSILLECTOMY    . VASECTOMY      Current Medications: Current Meds  Medication Sig  . acetaminophen (TYLENOL) 500 MG tablet Take 500 mg by mouth as needed for mild pain.  . fluticasone (FLONASE) 50 MCG/ACT nasal spray Place 1-2 sprays into both nostrils at bedtime as needed (congestion).   . furosemide (LASIX) 40 MG tablet Take 1 tablet (40 mg total) by mouth daily as needed for fluid.  . Rivaroxaban (XARELTO) 15 MG TABS tablet TAKE 1 TABLET BY MOUTH ONCE DAILY WITH SUPPER  . sildenafil (VIAGRA) 100 MG tablet Take 100 mg by mouth daily as needed for erectile dysfunction.      Allergies:   Adhesive [tape] and Codeine   Social History   Socioeconomic History  . Marital status: Married    Spouse name: Not on file  . Number of children: Not on file  . Years of education: Not on file  . Highest education level: Not on file  Occupational History  . Not on file  Social Needs  .  Financial resource strain: Not on file  . Food insecurity    Worry: Not on file    Inability: Not on file  . Transportation needs    Medical: Not on file    Non-medical: Not on file  Tobacco Use  . Smoking status: Never Smoker  . Smokeless tobacco: Never Used  Substance and Sexual Activity  . Alcohol use: Yes    Comment: 07/24/2014 "might have 3 beers/month"  . Drug use: No  . Sexual activity: Yes  Lifestyle  . Physical activity    Days per week: Not on file    Minutes per session: Not on file  . Stress: Not on file  Relationships  . Social Herbalist on phone: Not on file    Gets together: Not on file    Attends religious service:  Not on file    Active member of club or organization: Not on file    Attends meetings of clubs or organizations: Not on file    Relationship status: Not on file  Other Topics Concern  . Not on file  Social History Narrative  . Not on file     Family History: The patient's family history includes Cancer in his brother and mother; Diabetes in his brother; Heart attack in his brother; Heart disease in his brother; Hypertension in his brother. There is no history of Stroke.  ROS:   Please see the history of present illness.     All other systems reviewed and are negative.  EKGs/Labs/Other Studies Reviewed:    The following studies were reviewed today: ECHO 07/24/14: - Left ventricle: The cavity size was normal. Wall thickness was increased in a pattern of mild LVH. Systolic function was normal. The estimated ejection fraction was in the range of 60% to 65%. Wall motion was normal; there were no regional wall motion abnormalities. The study is not technically sufficient to allow evaluation of LV diastolic function. - Aortic valve: There was trivial regurgitation. - Mitral valve: There was moderate regurgitation. - Left atrium: The atrium was mildly dilated. - Right atrium: The atrium was moderately to severely dilated. - Tricuspid valve: There was mild-moderate regurgitation. - Pulmonary arteries: PA peak pressure: 42 mm Hg (S).  EKG: 10/19/17 was personally reviewed today and demonstrated:  Sinus rhythm/P waves noted, ventricular pacing  Recent Labs: No results found for requested labs within last 8760 hours.  Recent Lipid Panel No results found for: CHOL, TRIG, HDL, CHOLHDL, VLDL, LDLCALC, LDLDIRECT  Physical Exam:    VS:  BP (!) 90/50   Pulse 73   Ht 5\' 11"  (1.803 m)   Wt 175 lb 12.8 oz (79.7 kg)   SpO2 98%   BMI 24.52 kg/m     Wt Readings from Last 3 Encounters:  12/12/18 175 lb 12.8 oz (79.7 kg)  07/07/18 180 lb (81.6 kg)  06/06/18 180 lb (81.6 kg)      GEN:  Well nourished, well developed in no acute distress HEENT: right cheek squamous cell removal.  NECK: No JVD; No carotid bruits LYMPHATICS: No lymphadenopathy CARDIAC: RRR, no murmurs, rubs, gallops RESPIRATORY:  Clear to auscultation without rales, wheezing or rhonchi  ABDOMEN: Soft, non-tender, non-distended MUSCULOSKELETAL:  No edema; No deformity  SKIN: Warm and dry NEUROLOGIC:  Alert and oriented x 3 PSYCHIATRIC:  Normal affect   ASSESSMENT:    1. Complete heart block ([place])   2. Atypical atrial flutter (HCC)   3. Chronic anticoagulation   4. Essential hypertension  PLAN:    In order of problems listed above:  Sick sinus syndrome/complete heart block - Pacemaker 2016, [employer] Jude.  Doing well.  EP has been following. - After initial implant, lead revision was needed.  Overall has been stable since then.  No changes made.  Atrial fibrillation/flutter - Previously it was noted that he had 4-1 atrial conduction in the setting of flutter.  Xarelto 15mg  has been utilized for anticoagulation.  Chads vas score is 3 for age and hypertension.  Doing well.  Dr. has been periodically checking his blood work.  Last hemoglobin and creatinine stable.  12.8, 1.3 11/09/2018  Essential hypertension -Overall been doing quite well.  At one point had elevated blood pressures that he felt was related to increased salt load.  Took a few days of Lasix, this improved. Has not needed any recently.  Doing well.  In fact today it is quite low.  Usually at home it is nowhere near this low.  He is asymptomatic.    Medication Adjustments/Labs and Tests Ordered: Current medicines are reviewed at length with the patient today.  Concerns regarding medicines are outlined above.  No orders of the defined types were placed in this encounter.  No orders of the defined types were placed in this encounter.   Patient Instructions  Medication Instructions:  Your physician recommends that  you continue on your current medications as directed. Please refer to the Current Medication list given to you today.  *If you need a refill on your cardiac medications before your next appointment, please call your pharmacy*  Follow-Up: At [facility], you and your health needs are our priority.  As part of our continuing mission to provide you with exceptional heart care, we have created designated Provider Care Teams.  These Care Teams include your primary Cardiologist (physician) and Advanced Practice Providers (APPs -  Physician Assistants and Nurse Practitioners) who all work together to provide you with the care you need, when you need it.  Your next appointment:   6 month(s)  The format for your next appointment:   In Person  Provider:   You may see [facility], MD or one of the following Advanced Practice Providers on your designated Care Team:    Donato Schultz, NP  Norma Fredrickson, NP  Nada Boozer, NP     Signed, Georgie Chard, MD  12/12/2018 5:11 PM     [facility]

## 2018-12-12 NOTE — Patient Instructions (Signed)
Medication Instructions:  Your physician recommends that you continue on your current medications as directed. Please refer to the Current Medication list given to you today.  *If you need a refill on your cardiac medications before your next appointment, please call your pharmacy*   Follow-Up: At [facility], you and your health needs are our priority.  As part of our continuing mission to provide you with exceptional heart care, we have created designated Provider Care Teams.  These Care Teams include your primary Cardiologist (physician) and Advanced Practice Providers (APPs -  Physician Assistants and Nurse Practitioners) who all work together to provide you with the care you need, when you need it.   Your next appointment:   6 month(s)  The format for your next appointment:   In Person  Provider:   You may see Mark Skains, MD or one of the following Advanced Practice Providers on your designated Care Team:    Lori Gerhardt, NP  Laura Ingold, NP  Jill McDaniel, NP     

## 2019-01-31 ENCOUNTER — Ambulatory Visit (INDEPENDENT_AMBULATORY_CARE_PROVIDER_SITE_OTHER): Payer: Medicare Other | Admitting: *Deleted

## 2019-01-31 DIAGNOSIS — I442 Atrioventricular block, complete: Secondary | ICD-10-CM | POA: Diagnosis not present

## 2019-01-31 LAB — CUP PACEART REMOTE DEVICE CHECK
Battery Remaining Longevity: 125 mo
Battery Remaining Percentage: 95.5 %
Battery Voltage: 2.98 V
Brady Statistic AP VP Percent: 55 %
Brady Statistic AP VS Percent: 1 %
Brady Statistic AS VP Percent: 45 %
Brady Statistic AS VS Percent: 1 %
Brady Statistic RA Percent Paced: 20 %
Brady Statistic RV Percent Paced: 99 %
Date Time Interrogation Session: 20210111020018
Implantable Lead Implant Date: 20160705
Implantable Lead Implant Date: 20160705
Implantable Lead Location: 753859
Implantable Lead Location: 753860
Implantable Lead Model: 1948
Implantable Pulse Generator Implant Date: 20160705
Lead Channel Impedance Value: 380 Ohm
Lead Channel Impedance Value: 560 Ohm
Lead Channel Pacing Threshold Amplitude: 0.5 V
Lead Channel Pacing Threshold Amplitude: 0.75 V
Lead Channel Pacing Threshold Pulse Width: 0.4 ms
Lead Channel Pacing Threshold Pulse Width: 0.5 ms
Lead Channel Sensing Intrinsic Amplitude: 0.6 mV
Lead Channel Sensing Intrinsic Amplitude: 12 mV
Lead Channel Setting Pacing Amplitude: 0.75 V
Lead Channel Setting Pacing Amplitude: 1.75 V
Lead Channel Setting Pacing Pulse Width: 0.4 ms
Lead Channel Setting Sensing Sensitivity: 2 mV
Pulse Gen Model: 2240
Pulse Gen Serial Number: 7779900

## 2019-02-09 DIAGNOSIS — D649 Anemia, unspecified: Secondary | ICD-10-CM | POA: Diagnosis not present

## 2019-03-26 ENCOUNTER — Ambulatory Visit: Payer: Medicare Other | Attending: Internal Medicine

## 2019-03-26 DIAGNOSIS — Z23 Encounter for immunization: Secondary | ICD-10-CM | POA: Insufficient documentation

## 2019-03-26 NOTE — Progress Notes (Signed)
   Covid-19 Vaccination Clinic  Name:  Matthew [name]    MRN: [id number] DOB: February 11, 1931  03/26/2019  Matthew [name] was observed post Covid-19 immunization for 30 minutes based on pre-vaccination screening without incident. He was provided with Vaccine Information Sheet and instruction to access the V-Safe system.   Matthew [name] was instructed to call 911 with any severe reactions post vaccine: Marland Kitchen Difficulty breathing  . Swelling of face and throat  . A fast heartbeat  . A bad rash all over body  . Dizziness and weakness   Immunizations Administered    Name Date Dose VIS Date Route   Pfizer COVID-19 Vaccine 03/26/2019  1:25 PM 0.3 mL 12/30/2018 Intramuscular   Manufacturer: [employer], [employer]   Lot: [id number]   NDC: [address]-0

## 2019-04-25 ENCOUNTER — Ambulatory Visit: Payer: Medicare Other | Attending: Internal Medicine

## 2019-04-25 DIAGNOSIS — Z23 Encounter for immunization: Secondary | ICD-10-CM

## 2019-04-25 NOTE — Progress Notes (Signed)
   Covid-19 Vaccination Clinic  Name:  Matthew [name]    MRN: [id number] DOB: [birth year]  04/25/2019  Matthew [name] was observed post Covid-19 immunization for 15 minutes without incident. He was provided with Vaccine Information Sheet and instruction to access the V-Safe system.   Matthew [name] was instructed to call 911 with any severe reactions post vaccine: Marland Kitchen Difficulty breathing  . Swelling of face and throat  . A fast heartbeat  . A bad rash all over body  . Dizziness and weakness   Immunizations Administered    Name Date Dose VIS Date Route   Pfizer COVID-19 Vaccine 04/25/2019 12:09 PM 0.3 mL 12/30/2018 Intramuscular   Manufacturer: [employer], [employer]   Lot: [id number]   NDC: [address]-5

## 2019-05-02 ENCOUNTER — Ambulatory Visit (INDEPENDENT_AMBULATORY_CARE_PROVIDER_SITE_OTHER): Payer: Medicare Other | Admitting: *Deleted

## 2019-05-02 DIAGNOSIS — I442 Atrioventricular block, complete: Secondary | ICD-10-CM | POA: Diagnosis not present

## 2019-05-02 LAB — CUP PACEART REMOTE DEVICE CHECK
Battery Remaining Longevity: 121 mo
Battery Remaining Percentage: 95.5 %
Battery Voltage: 2.96 V
Brady Statistic AP VP Percent: 53 %
Brady Statistic AP VS Percent: 1 %
Brady Statistic AS VP Percent: 47 %
Brady Statistic AS VS Percent: 1 %
Brady Statistic RA Percent Paced: 18 %
Brady Statistic RV Percent Paced: 99 %
Date Time Interrogation Session: 20210413044827
Implantable Lead Implant Date: 20160705
Implantable Lead Implant Date: 20160705
Implantable Lead Location: 753859
Implantable Lead Location: 753860
Implantable Lead Model: 1948
Implantable Pulse Generator Implant Date: 20160705
Lead Channel Impedance Value: 380 Ohm
Lead Channel Impedance Value: 600 Ohm
Lead Channel Pacing Threshold Amplitude: 0.5 V
Lead Channel Pacing Threshold Amplitude: 0.625 V
Lead Channel Pacing Threshold Pulse Width: 0.4 ms
Lead Channel Pacing Threshold Pulse Width: 0.5 ms
Lead Channel Sensing Intrinsic Amplitude: 0.8 mV
Lead Channel Sensing Intrinsic Amplitude: 12 mV
Lead Channel Setting Pacing Amplitude: 0.75 V
Lead Channel Setting Pacing Amplitude: 1.625
Lead Channel Setting Pacing Pulse Width: 0.4 ms
Lead Channel Setting Sensing Sensitivity: 2 mV
Pulse Gen Model: 2240
Pulse Gen Serial Number: 7779900

## 2019-05-03 NOTE — Progress Notes (Signed)
PPM Remote  

## 2019-05-10 ENCOUNTER — Other Ambulatory Visit: Payer: Self-pay

## 2019-05-10 ENCOUNTER — Ambulatory Visit: Payer: Medicare Other | Admitting: Cardiology

## 2019-05-10 ENCOUNTER — Encounter: Payer: Self-pay | Admitting: Cardiology

## 2019-05-10 VITALS — BP 108/74 | HR 70 | Ht 71.0 in | Wt 176.6 lb

## 2019-05-10 DIAGNOSIS — Z7901 Long term (current) use of anticoagulants: Secondary | ICD-10-CM | POA: Diagnosis not present

## 2019-05-10 DIAGNOSIS — I442 Atrioventricular block, complete: Secondary | ICD-10-CM

## 2019-05-10 DIAGNOSIS — I484 Atypical atrial flutter: Secondary | ICD-10-CM

## 2019-05-10 DIAGNOSIS — I4819 Other persistent atrial fibrillation: Secondary | ICD-10-CM

## 2019-05-10 NOTE — Patient Instructions (Signed)
Medication Instructions:   Your physician recommends that you continue on your current medications as directed. Please refer to the Current Medication list given to you today.  *If you need a refill on your cardiac medications before your next appointment, please call your pharmacy*   Follow-Up: At [facility], you and your health needs are our priority.  As part of our continuing mission to provide you with exceptional heart care, we have created designated Provider Care Teams.  These Care Teams include your primary Cardiologist (physician) and Advanced Practice Providers (APPs -  Physician Assistants and Nurse Practitioners) who all work together to provide you with the care you need, when you need it.  We recommend signing up for the patient portal called "MyChart".  Sign up information is provided on this After Visit Summary.  MyChart is used to connect with patients for Virtual Visits (Telemedicine).  Patients are able to view lab/test results, encounter notes, upcoming appointments, etc.  Non-urgent messages can be sent to your provider as well.   To learn more about what you can do with MyChart, go to [email or web address].    Your next appointment:   12 month(s)  The format for your next appointment:   In Person  Provider:   Donato Schultz, MD   Other Instructions  PLEASE INCREASE YOUR WATER INTAKE PER DR. Anne Fu

## 2019-05-10 NOTE — Progress Notes (Signed)
Cardiology Office Note:    Date:  05/10/2019   ID:  Matthew [name], DOB 1931-03-24, MRN [id number]  PCP:  Merlene Laughter, MD  Cardiologist:  Donato Schultz, MD  Electrophysiologist:  None   Referring MD: Merlene Laughter, MD     History of Present Illness:    Matthew [name] is a 86 y.o. male here for complete heart block pacemaker follow-up.  Overall has been doing quite well.  Pacing well. We always enjoyed talking about his grandsonWoodMyers, former Network engineer who played minor league in the [place] organization and finally moved onto potential coaching/recruiting positions. AAA/AA, spring training went well, cut him. A few weeks, [address] [place], talent scout, Copywriter, advertising. Married 11/2018.   Previously, Xarelto was decreased to 15 mg.  Overall he has been doing quite well.  Maybe feels a little bit of dizziness when getting up off the couch.  Past Medical History:  Diagnosis Date  . Chronic venous insufficiency 02/06/2011  . Complete heart block (HCC) 07/20/2014   a. s/p STJ dual chamber PPM   . Family history of adverse reaction to anesthesia    "mother had carotid OR; they put her too deep; heart stopped in middle of operation; had to do CPR; broke a bunch of ribs; brother went too deep also; they had hard time gettin him out of it"  . Gilbert's syndrome   . Macular degeneration   . Paroxysmal atrial fibrillation (HCC) 07/20/2014  . Rosacea   . Thrombocytopenia (HCC)    Mild per Dr. Arbutus Ped  . Varicose veins of lower extremities with other complications 02/06/2011    Past Surgical History:  Procedure Laterality Date  . CARDIOVERSION  08/07/2011   Procedure: CARDIOVERSION;  Surgeon: Donato Schultz, MD;  [facility];  Service: Cardiovascular;  Laterality: N/A;  . ENDOVENOUS ABLATION SAPHENOUS VEIN W/ LASER Left 06-22-11   greater saphenous vein   . EP IMPLANTABLE DEVICE N/A 07/24/2014   STJ dual chamber PPM implanted by Dr Johney Frame for SSS  . EP IMPLANTABLE DEVICE  N/A 07/25/2014   RA lead revision by Dr Ladona Ridgel  . KNEE ARTHROSCOPY Right   . TONSILLECTOMY    . VASECTOMY      Current Medications: Current Meds  Medication Sig  . acetaminophen (TYLENOL) 500 MG tablet Take 500 mg by mouth as needed for mild pain.  . fluticasone (FLONASE) 50 MCG/ACT nasal spray Place 1-2 sprays into both nostrils at bedtime as needed (congestion).   . furosemide (LASIX) 40 MG tablet Take 1 tablet (40 mg total) by mouth daily as needed for fluid.  . Rivaroxaban (XARELTO) 15 MG TABS tablet TAKE 1 TABLET BY MOUTH ONCE DAILY WITH SUPPER  . sildenafil (VIAGRA) 100 MG tablet Take 100 mg by mouth daily as needed for erectile dysfunction.      Allergies:   Adhesive [tape] and Codeine   Social History   Socioeconomic History  . Marital status: Married    Spouse name: Not on file  . Number of children: Not on file  . Years of education: Not on file  . Highest education level: Not on file  Occupational History  . Not on file  Tobacco Use  . Smoking status: Never Smoker  . Smokeless tobacco: Never Used  Substance and Sexual Activity  . Alcohol use: Yes    Comment: 07/24/2014 "might have 3 beers/month"  . Drug use: No  . Sexual activity: Yes  Other Topics Concern  . Not on file  Social History Narrative  . Not on file   Social Determinants of Health   Financial Resource Strain:   . Difficulty of Paying Living Expenses:   Food Insecurity:   . Worried About Charity fundraiser in the Last Year:   . Arboriculturist in the Last Year:   Transportation Needs:   . Film/video editor (Medical):   Marland Kitchen Lack of Transportation (Non-Medical):   Physical Activity:   . Days of Exercise per Week:   . Minutes of Exercise per Session:   Stress:   . Feeling of Stress :   Social Connections:   . Frequency of Communication with Friends and Family:   . Frequency of Social Gatherings with Friends and Family:   . Attends Religious Services:   . Active Member of Clubs or  Organizations:   . Attends Archivist Meetings:   Marland Kitchen Marital Status:      Family History: The patient's family history includes Cancer in his brother and mother; Diabetes in his brother; Heart attack in his brother; Heart disease in his brother; Hypertension in his brother. There is no history of Stroke.  ROS:   Please see the history of present illness.     All other systems reviewed and are negative.  EKGs/Labs/Other Studies Reviewed:    The following studies were reviewed today: Prior echo reviewed  EKG:  EKG is  ordered today.  The ekg ordered today demonstrates 70 ventricular pacing  Recent Labs: No results found for requested labs within last 8760 hours.  Recent Lipid Panel No results found for: CHOL, TRIG, HDL, CHOLHDL, VLDL, LDLCALC, LDLDIRECT  Physical Exam:    VS:  BP 108/74   Pulse 70   Ht 5\' 11"  (1.803 m)   Wt 176 lb 9.6 oz (80.1 kg)   SpO2 99%   BMI 24.63 kg/m     Wt Readings from Last 3 Encounters:  05/10/19 176 lb 9.6 oz (80.1 kg)  12/12/18 175 lb 12.8 oz (79.7 kg)  07/07/18 180 lb (81.6 kg)     GEN:  Well nourished, well developed in no acute distress HEENT: Normal NECK: No JVD; No carotid bruits LYMPHATICS: No lymphadenopathy CARDIAC: RRR, no murmurs, rubs, gallops RESPIRATORY:  Clear to auscultation without rales, wheezing or rhonchi  ABDOMEN: Soft, non-tender, non-distended MUSCULOSKELETAL:  No edema; No deformity  SKIN: Warm and dry NEUROLOGIC:  Alert and oriented x 3 PSYCHIATRIC:  Normal affect   ASSESSMENT:    1. Atypical atrial flutter ([place])   2. Complete heart block ([place])   3. Chronic anticoagulation   4. Other persistent atrial fibrillation ([place])    PLAN:    In order of problems listed above:  Complete heart block -Pacemaker 2016 [facility].  Doing well.  EP is also following.  Atrial fibrillation/flutter -Xarelto 15 mg for anticoagulation.  Previous 4-1 atrial conduction.  CHA2DS2-VASc 3.  Hemoglobin 12.9 creatinine  1.31  Essential hypertension -Has been low in the past at visits.  Today 108/74.  Perfect.  Intact with his subtle dizziness when getting up off the couch, he may wish to liberalize his salt and drink some more fluids.   Medication Adjustments/Labs and Tests Ordered: Current medicines are reviewed at length with the patient today.  Concerns regarding medicines are outlined above.  Orders Placed This Encounter  Procedures  . EKG 12-Lead   No orders of the defined types were placed in this encounter.   Patient Instructions  Medication Instructions:  Your physician recommends that you continue on your current medications as directed. Please refer to the Current Medication list given to you today.  *If you need a refill on your cardiac medications before your next appointment, please call your pharmacy*   Follow-Up: At [facility], you and your health needs are our priority.  As part of our continuing mission to provide you with exceptional heart care, we have created designated Provider Care Teams.  These Care Teams include your primary Cardiologist (physician) and Advanced Practice Providers (APPs -  Physician Assistants and Nurse Practitioners) who all work together to provide you with the care you need, when you need it.  We recommend signing up for the patient portal called "MyChart".  Sign up information is provided on this After Visit Summary.  MyChart is used to connect with patients for Virtual Visits (Telemedicine).  Patients are able to view lab/test results, encounter notes, upcoming appointments, etc.  Non-urgent messages can be sent to your provider as well.   To learn more about what you can do with MyChart, go to [email or web address].    Your next appointment:   12 month(s)  The format for your next appointment:   In Person  Provider:   Donato Schultz, MD   Other Instructions  PLEASE INCREASE YOUR WATER INTAKE PER DR. Anne Fu     Signed, Donato Schultz, MD   05/10/2019 4:21 PM    [facility]

## 2019-06-23 ENCOUNTER — Other Ambulatory Visit: Payer: Self-pay | Admitting: Cardiology

## 2019-06-23 NOTE — Telephone Encounter (Signed)
Pt last saw Dr Anne Fu 05/10/19, last labs 11/09/18 Creat 1.31 at [facility] per [facility], age 86, weight 80.1kg, CrCl 46.71, based on CrCl pt is on appropriate dosage of Xarelto 15mg  QD.  Will refill rx.

## 2019-08-01 ENCOUNTER — Ambulatory Visit (INDEPENDENT_AMBULATORY_CARE_PROVIDER_SITE_OTHER): Payer: Medicare Other | Admitting: *Deleted

## 2019-08-01 DIAGNOSIS — I495 Sick sinus syndrome: Secondary | ICD-10-CM

## 2019-08-01 LAB — CUP PACEART REMOTE DEVICE CHECK
Battery Remaining Longevity: 121 mo
Battery Remaining Percentage: 95.5 %
Battery Voltage: 2.96 V
Brady Statistic AP VP Percent: 53 %
Brady Statistic AP VS Percent: 1 %
Brady Statistic AS VP Percent: 47 %
Brady Statistic AS VS Percent: 1 %
Brady Statistic RA Percent Paced: 16 %
Brady Statistic RV Percent Paced: 99 %
Date Time Interrogation Session: 20210713020014
Implantable Lead Implant Date: 20160705
Implantable Lead Implant Date: 20160705
Implantable Lead Location: 753859
Implantable Lead Location: 753860
Implantable Lead Model: 1948
Implantable Pulse Generator Implant Date: 20160705
Lead Channel Impedance Value: 400 Ohm
Lead Channel Impedance Value: 580 Ohm
Lead Channel Pacing Threshold Amplitude: 0.5 V
Lead Channel Pacing Threshold Amplitude: 0.625 V
Lead Channel Pacing Threshold Pulse Width: 0.4 ms
Lead Channel Pacing Threshold Pulse Width: 0.5 ms
Lead Channel Sensing Intrinsic Amplitude: 1 mV
Lead Channel Sensing Intrinsic Amplitude: 12 mV
Lead Channel Setting Pacing Amplitude: 0.75 V
Lead Channel Setting Pacing Amplitude: 1.625
Lead Channel Setting Pacing Pulse Width: 0.4 ms
Lead Channel Setting Sensing Sensitivity: 2 mV
Pulse Gen Model: 2240
Pulse Gen Serial Number: 7779900

## 2019-08-02 NOTE — Progress Notes (Signed)
Remote pacemaker transmission.   

## 2019-08-08 NOTE — Progress Notes (Addendum)
Cardiology Office Note Date:  08/09/2019  Patient ID:  Klinton, Candelas Dec 01, 1931, MRN [id number] PCP:  Merlene Laughter, MD  Cardiologist:  Dr. Anne Fu EP-Dr. Allred    Chief Complaint:  annual device visit   History of Present Illness: KRISTIAN MOGG is a 86 y.o. male with history of AFlutter/fib, SSx w/PPM with CHB, chronic venous insufficiency, Gilgert's syndrome, macular degeneration.  He comes in today to be seen for Dr.Allred, last seen by him via tele health visit June 2020.  He was doing well, up to date with remotes, AF burden was 32% and without symptoms,  Noted some undersensing of her flutter waves and suspected her AF was more persistent since Oct  Most recently he saw Dr. Anne Fu in April, he was feeling well, maintained on xarelto, no changes were made, labs were monitored via her PMD.  He is accompanied by his wife today. He feels well, denies any kind of CP, palpitations, or SOB.  He mentions he had been pretty sedentary over the whole COVID restrictions.  Denies any changes to his exertional capacity though, no difficulties with his ADLs. He mentions that the only time he is aware of any symptom is that when he has been napping and is and gets up quickly to get the phone "or something like that" he has a few seconds of "something", no palpitations, no real symptoms, just a quick awareness of something. No dizzy spells, no near syncope or syncope.   He gets bruises on his hands, though otherwise no bleeding or signs of bleeding.  He sees his PMD 2x a year and gets labs there   Device information SJM dual chamber PPM implanted 07/24/2014   Past Medical History:  Diagnosis Date  . Chronic venous insufficiency 02/06/2011  . Complete heart block (HCC) 07/20/2014   a. s/p STJ dual chamber PPM   . Family history of adverse reaction to anesthesia    "mother had carotid OR; they put her too deep; heart stopped in middle of operation; had to do CPR; broke a bunch of ribs;  brother went too deep also; they had hard time gettin him out of it"  . Gilbert's syndrome   . Macular degeneration   . Paroxysmal atrial fibrillation (HCC) 07/20/2014  . Rosacea   . Thrombocytopenia (HCC)    Mild per Dr. Arbutus Ped  . Varicose veins of lower extremities with other complications 02/06/2011    Past Surgical History:  Procedure Laterality Date  . CARDIOVERSION  08/07/2011   Procedure: CARDIOVERSION;  Surgeon: Donato Schultz, MD;  Location: [facility] OR;  Service: Cardiovascular;  Laterality: N/A;  . ENDOVENOUS ABLATION SAPHENOUS VEIN W/ LASER Left 06-22-11   greater saphenous vein   . EP IMPLANTABLE DEVICE N/A 07/24/2014   STJ dual chamber PPM implanted by Dr Johney Frame for SSS  . EP IMPLANTABLE DEVICE N/A 07/25/2014   RA lead revision by Dr Ladona Ridgel  . KNEE ARTHROSCOPY Right   . TONSILLECTOMY    . VASECTOMY      Current Outpatient Medications  Medication Sig Dispense Refill  . acetaminophen (TYLENOL) 500 MG tablet Take 500 mg by mouth as needed for mild pain.    . fluticasone (FLONASE) 50 MCG/ACT nasal spray Place 1-2 sprays into both nostrils at bedtime as needed (congestion).     . furosemide (LASIX) 40 MG tablet Take 1 tablet (40 mg total) by mouth daily as needed for fluid. 30 tablet 1  . sildenafil (VIAGRA) 100 MG tablet Take 100 mg  by mouth daily as needed for erectile dysfunction.     Carlena Hurl 15 MG TABS tablet TAKE 1 TABLET BY MOUTH ONCE DAILY WITH SUPPER 90 tablet 1   No current facility-administered medications for this visit.    Allergies:   Adhesive [tape] and Codeine   Social History:  The patient  reports that he has never smoked. He has never used smokeless tobacco. He reports current alcohol use. He reports that he does not use drugs.   Family History:  The patient's family history includes Cancer in his brother and mother; Diabetes in his brother; Heart attack in his brother; Heart disease in his brother; Hypertension in his brother.  ROS:  Please see the history of  present illness.  All other systems are reviewed and otherwise negative.   PHYSICAL EXAM:  VS:  BP 134/82   Pulse 73   Ht 5\' 11"  (1.803 m)   Wt 177 lb (80.3 kg)   SpO2 97%   BMI 24.69 kg/m  BMI: Body mass index is 24.69 kg/m. Well nourished, well developed, in no acute distress  HEENT: normocephalic, atraumatic  Neck: no JVD, carotid bruits or masses Cardiac:  RRR; no significant murmurs, no rubs, or gallops Lungs:  CTA b/l, no wheezing, rhonchi or rales  Abd: soft, nontender MS: no deformity, age appropriate atrophy Ext: trace edema  Skin: warm and dry, no rash Neuro:  No gross deficits appreciated Psych: euthymic mood, full affect  PPM site is stable, no tethering or discomfort   EKG:  Not done today  PPM interrogation done today and reviewed by myself: Battery and lead measurements are good He presents in AFlutte/VP He is device dependent at 40bpm today. While here he is noted to come out of a mode switch despite still being in AFlutter and V rate goes to 110bpm   07/24/2014: TTE Study Conclusions  - Left ventricle: The cavity size was normal. Wall thickness was  increased in a pattern of mild LVH. Systolic function was normal.  The estimated ejection fraction was in the range of 60% to 65%.  Wall motion was normal; there were no regional wall motion  abnormalities. The study is not technically sufficient to allow  evaluation of LV diastolic function.  - Aortic valve: There was trivial regurgitation.  - Mitral valve: There was moderate regurgitation.  - Left atrium: The atrium was mildly dilated.  - Right atrium: The atrium was moderately to severely dilated.  - Tricuspid valve: There was mild-moderate regurgitation.  - Pulmonary arteries: PA peak pressure: 42 mm Hg (S).    Recent Labs: No results found for requested labs within last 8760 hours.  No results found for requested labs within last 8760 hours.   CrCl cannot be calculated (Patient's most  recent lab result is older than the maximum 21 days allowed.).   Wt Readings from Last 3 Encounters:  08/09/19 177 lb (80.3 kg)  05/10/19 176 lb 9.6 oz (80.1 kg)  12/12/18 175 lb 12.8 oz (79.7 kg)     Other studies reviewed: Additional studies/records reviewed today include: summarized above  ASSESSMENT AND PLAN:  1. PPM     Stable measurements      2. Persistent AFib/flutter     CHA2DSVasc is 2 for age, on Xarelto appropriately dosed by his last Creat ([facility] 1.310 Oct 2020)      His pacer notes AS/VS today while with me undersensing his Aflutter, in review of all his available AMS EGMs, has some AP even  despite being in  AFlutter with flutter beats in blanking, or undersensing flutter waves.     In review of his remotes in the last year he is in AFlutter  His AF burden is reported 40%, though in review of his histograms he appears to be 100% AFlutter, asymptomatic and rate controlled (V paced >99%)  He feels quite well, without exertional intolerances I have programmed him from DDDR > VVIR     Disposition: F/u with Q 3 mo remotes and in clinic in 1 year, sooner if needed.  Current medicines are reviewed at length with the patient today.  The patient did not have any concerns regarding medicines.  Norma Fredrickson, PA-C 08/09/2019 3:01 PM     [facility] [address] [place] [address] [phone] (office)  [phone] (fax)

## 2019-08-09 ENCOUNTER — Ambulatory Visit: Payer: Medicare Other | Admitting: Physician Assistant

## 2019-08-09 ENCOUNTER — Other Ambulatory Visit: Payer: Self-pay | Admitting: *Deleted

## 2019-08-09 ENCOUNTER — Other Ambulatory Visit: Payer: Self-pay

## 2019-08-09 VITALS — BP 134/82 | HR 73 | Ht 71.0 in | Wt 177.0 lb

## 2019-08-09 DIAGNOSIS — I442 Atrioventricular block, complete: Secondary | ICD-10-CM | POA: Diagnosis not present

## 2019-08-09 DIAGNOSIS — I4891 Unspecified atrial fibrillation: Secondary | ICD-10-CM | POA: Diagnosis not present

## 2019-08-09 DIAGNOSIS — I4811 Longstanding persistent atrial fibrillation: Secondary | ICD-10-CM

## 2019-08-09 DIAGNOSIS — Z950 Presence of cardiac pacemaker: Secondary | ICD-10-CM | POA: Diagnosis not present

## 2019-08-09 NOTE — Patient Instructions (Signed)

## 2019-08-14 MED ORDER — RIVAROXABAN 15 MG PO TABS: ORAL_TABLET | ORAL | 1 refills | Status: DC

## 2019-09-01 DIAGNOSIS — B372 Candidiasis of skin and nail: Secondary | ICD-10-CM | POA: Diagnosis not present

## 2019-09-01 DIAGNOSIS — W57XXXA Bitten or stung by nonvenomous insect and other nonvenomous arthropods, initial encounter: Secondary | ICD-10-CM | POA: Diagnosis not present

## 2019-09-01 DIAGNOSIS — S30861A Insect bite (nonvenomous) of abdominal wall, initial encounter: Secondary | ICD-10-CM | POA: Diagnosis not present

## 2019-10-12 DIAGNOSIS — H43813 Vitreous degeneration, bilateral: Secondary | ICD-10-CM | POA: Diagnosis not present

## 2019-10-12 DIAGNOSIS — H25813 Combined forms of age-related cataract, bilateral: Secondary | ICD-10-CM | POA: Diagnosis not present

## 2019-10-31 ENCOUNTER — Ambulatory Visit (INDEPENDENT_AMBULATORY_CARE_PROVIDER_SITE_OTHER): Payer: Medicare Other

## 2019-10-31 DIAGNOSIS — I495 Sick sinus syndrome: Secondary | ICD-10-CM

## 2019-10-31 LAB — CUP PACEART REMOTE DEVICE CHECK
Battery Remaining Longevity: 148 mo
Battery Remaining Percentage: 95.5 %
Battery Voltage: 2.98 V
Brady Statistic RV Percent Paced: 99 %
Date Time Interrogation Session: 20211012020013
Implantable Lead Implant Date: 20160705
Implantable Lead Implant Date: 20160705
Implantable Lead Location: 753859
Implantable Lead Location: 753860
Implantable Lead Model: 1948
Implantable Pulse Generator Implant Date: 20160705
Lead Channel Impedance Value: 560 Ohm
Lead Channel Pacing Threshold Amplitude: 0.5 V
Lead Channel Pacing Threshold Pulse Width: 0.4 ms
Lead Channel Sensing Intrinsic Amplitude: 12 mV
Lead Channel Setting Pacing Amplitude: 0.75 V
Lead Channel Setting Pacing Pulse Width: 0.4 ms
Lead Channel Setting Sensing Sensitivity: 2 mV
Pulse Gen Model: 2240
Pulse Gen Serial Number: 7779900

## 2019-11-01 NOTE — Progress Notes (Signed)
Remote pacemaker transmission.   

## 2019-11-14 DIAGNOSIS — Z0000 Encounter for general adult medical examination without abnormal findings: Secondary | ICD-10-CM | POA: Diagnosis not present

## 2019-11-14 DIAGNOSIS — Z950 Presence of cardiac pacemaker: Secondary | ICD-10-CM | POA: Diagnosis not present

## 2019-11-14 DIAGNOSIS — D696 Thrombocytopenia, unspecified: Secondary | ICD-10-CM | POA: Diagnosis not present

## 2019-11-14 DIAGNOSIS — I480 Paroxysmal atrial fibrillation: Secondary | ICD-10-CM | POA: Diagnosis not present

## 2019-11-28 DIAGNOSIS — Z0120 Encounter for dental examination and cleaning without abnormal findings: Secondary | ICD-10-CM | POA: Diagnosis not present

## 2020-01-23 ENCOUNTER — Telehealth: Payer: Self-pay | Admitting: Cardiology

## 2020-01-23 NOTE — Telephone Encounter (Signed)
Dr Dagoberto Ligas called to ask Dr Anne Fu a few question about this mutual pt of theirs  transfering call to [facility]   Cell number [phone]

## 2020-01-23 NOTE — Telephone Encounter (Signed)
Dr Anne Fu spoke with Dr Dagoberto Ligas regarding pt's upcoming eye surgery and whether the pt should hold his Xarelto or not.  Per Dr Anne Fu pt should hold 2 days prior to and restart the day after surgery.

## 2020-01-30 ENCOUNTER — Ambulatory Visit (INDEPENDENT_AMBULATORY_CARE_PROVIDER_SITE_OTHER): Payer: Medicare Other

## 2020-01-30 DIAGNOSIS — H11001 Unspecified pterygium of right eye: Secondary | ICD-10-CM | POA: Diagnosis not present

## 2020-01-30 DIAGNOSIS — H268 Other specified cataract: Secondary | ICD-10-CM | POA: Diagnosis not present

## 2020-01-30 DIAGNOSIS — I495 Sick sinus syndrome: Secondary | ICD-10-CM

## 2020-01-30 DIAGNOSIS — H25811 Combined forms of age-related cataract, right eye: Secondary | ICD-10-CM | POA: Diagnosis not present

## 2020-01-30 DIAGNOSIS — H52201 Unspecified astigmatism, right eye: Secondary | ICD-10-CM | POA: Diagnosis not present

## 2020-01-30 DIAGNOSIS — H11041 Peripheral pterygium, stationary, right eye: Secondary | ICD-10-CM | POA: Diagnosis not present

## 2020-01-30 LAB — CUP PACEART REMOTE DEVICE CHECK
Battery Remaining Longevity: 148 mo
Battery Remaining Percentage: 95.5 %
Battery Voltage: 2.98 V
Brady Statistic RV Percent Paced: 99 %
Date Time Interrogation Session: 20220111020012
Implantable Lead Implant Date: 20160705
Implantable Lead Implant Date: 20160705
Implantable Lead Location: 753859
Implantable Lead Location: 753860
Implantable Lead Model: 1948
Implantable Pulse Generator Implant Date: 20160705
Lead Channel Impedance Value: 560 Ohm
Lead Channel Pacing Threshold Amplitude: 0.5 V
Lead Channel Pacing Threshold Pulse Width: 0.4 ms
Lead Channel Sensing Intrinsic Amplitude: 12 mV
Lead Channel Setting Pacing Amplitude: 0.75 V
Lead Channel Setting Pacing Pulse Width: 0.4 ms
Lead Channel Setting Sensing Sensitivity: 2 mV
Pulse Gen Model: 2240
Pulse Gen Serial Number: 7779900

## 2020-02-15 NOTE — Progress Notes (Signed)
Remote pacemaker transmission.   

## 2020-03-05 DIAGNOSIS — H25812 Combined forms of age-related cataract, left eye: Secondary | ICD-10-CM | POA: Diagnosis not present

## 2020-03-05 DIAGNOSIS — H268 Other specified cataract: Secondary | ICD-10-CM | POA: Diagnosis not present

## 2020-03-05 DIAGNOSIS — H52202 Unspecified astigmatism, left eye: Secondary | ICD-10-CM | POA: Diagnosis not present

## 2020-04-16 DIAGNOSIS — E559 Vitamin D deficiency, unspecified: Secondary | ICD-10-CM | POA: Diagnosis not present

## 2020-04-16 DIAGNOSIS — R413 Other amnesia: Secondary | ICD-10-CM | POA: Diagnosis not present

## 2020-04-16 DIAGNOSIS — Z7189 Other specified counseling: Secondary | ICD-10-CM | POA: Diagnosis not present

## 2020-04-22 DIAGNOSIS — E559 Vitamin D deficiency, unspecified: Secondary | ICD-10-CM | POA: Diagnosis not present

## 2020-04-22 DIAGNOSIS — R413 Other amnesia: Secondary | ICD-10-CM | POA: Diagnosis not present

## 2020-04-24 DIAGNOSIS — Z950 Presence of cardiac pacemaker: Secondary | ICD-10-CM | POA: Diagnosis not present

## 2020-04-24 DIAGNOSIS — N1831 Chronic kidney disease, stage 3a: Secondary | ICD-10-CM | POA: Diagnosis not present

## 2020-04-24 DIAGNOSIS — D696 Thrombocytopenia, unspecified: Secondary | ICD-10-CM | POA: Diagnosis not present

## 2020-04-24 DIAGNOSIS — I480 Paroxysmal atrial fibrillation: Secondary | ICD-10-CM | POA: Diagnosis not present

## 2020-04-30 ENCOUNTER — Ambulatory Visit (INDEPENDENT_AMBULATORY_CARE_PROVIDER_SITE_OTHER): Payer: Medicare Other

## 2020-04-30 DIAGNOSIS — I495 Sick sinus syndrome: Secondary | ICD-10-CM

## 2020-04-30 LAB — CUP PACEART REMOTE DEVICE CHECK
Battery Remaining Longevity: 148 mo
Battery Remaining Percentage: 95.5 %
Battery Voltage: 2.98 V
Brady Statistic RV Percent Paced: 99 %
Date Time Interrogation Session: 20220412020014
Implantable Lead Implant Date: 20160705
Implantable Lead Implant Date: 20160705
Implantable Lead Location: 753859
Implantable Lead Location: 753860
Implantable Lead Model: 1948
Implantable Pulse Generator Implant Date: 20160705
Lead Channel Impedance Value: 580 Ohm
Lead Channel Pacing Threshold Amplitude: 0.5 V
Lead Channel Pacing Threshold Pulse Width: 0.4 ms
Lead Channel Sensing Intrinsic Amplitude: 12 mV
Lead Channel Setting Pacing Amplitude: 0.75 V
Lead Channel Setting Pacing Pulse Width: 0.4 ms
Lead Channel Setting Sensing Sensitivity: 2 mV
Pulse Gen Model: 2240
Pulse Gen Serial Number: 7779900

## 2020-05-01 DIAGNOSIS — R400 Somnolence: Secondary | ICD-10-CM | POA: Diagnosis not present

## 2020-05-01 DIAGNOSIS — R413 Other amnesia: Secondary | ICD-10-CM | POA: Diagnosis not present

## 2020-05-01 DIAGNOSIS — I480 Paroxysmal atrial fibrillation: Secondary | ICD-10-CM | POA: Diagnosis not present

## 2020-05-01 DIAGNOSIS — R0683 Snoring: Secondary | ICD-10-CM | POA: Diagnosis not present

## 2020-05-06 DIAGNOSIS — G4733 Obstructive sleep apnea (adult) (pediatric): Secondary | ICD-10-CM | POA: Diagnosis not present

## 2020-05-15 NOTE — Progress Notes (Signed)
Remote pacemaker transmission.   

## 2020-05-21 ENCOUNTER — Other Ambulatory Visit (HOSPITAL_BASED_OUTPATIENT_CLINIC_OR_DEPARTMENT_OTHER): Payer: Self-pay

## 2020-05-21 DIAGNOSIS — R0683 Snoring: Secondary | ICD-10-CM

## 2020-05-21 DIAGNOSIS — R5383 Other fatigue: Secondary | ICD-10-CM

## 2020-05-21 DIAGNOSIS — G4710 Hypersomnia, unspecified: Secondary | ICD-10-CM

## 2020-06-10 ENCOUNTER — Other Ambulatory Visit: Payer: Self-pay

## 2020-06-10 ENCOUNTER — Encounter: Payer: Self-pay | Admitting: Cardiology

## 2020-06-10 ENCOUNTER — Ambulatory Visit: Payer: Medicare Other | Admitting: Cardiology

## 2020-06-10 VITALS — BP 124/70 | HR 60 | Ht 71.0 in | Wt 175.0 lb

## 2020-06-10 DIAGNOSIS — I442 Atrioventricular block, complete: Secondary | ICD-10-CM

## 2020-06-10 DIAGNOSIS — Z950 Presence of cardiac pacemaker: Secondary | ICD-10-CM

## 2020-06-10 DIAGNOSIS — I4811 Longstanding persistent atrial fibrillation: Secondary | ICD-10-CM

## 2020-06-10 NOTE — Patient Instructions (Signed)
Medication Instructions:  The current medical regimen is effective;  continue present plan and medications.  *If you need a refill on your cardiac medications before your next appointment, please call your pharmacy*  Follow-Up: At [facility], you and your health needs are our priority.  As part of our continuing mission to provide you with exceptional heart care, we have created designated Provider Care Teams.  These Care Teams include your primary Cardiologist (physician) and Advanced Practice Providers (APPs -  Physician Assistants and Nurse Practitioners) who all work together to provide you with the care you need, when you need it.  We recommend signing up for the patient portal called "MyChart".  Sign up information is provided on this After Visit Summary.  MyChart is used to connect with patients for Virtual Visits (Telemedicine).  Patients are able to view lab/test results, encounter notes, upcoming appointments, etc.  Non-urgent messages can be sent to your provider as well.   To learn more about what you can do with MyChart, go to [email or web address].    Your next appointment:   1 year(s)  The format for your next appointment:   In Person  Provider:   Mark Skains, MD   Thank you for choosing [facility]!!    

## 2020-06-10 NOTE — Progress Notes (Signed)
Cardiology Office Note:    Date:  06/10/2020   ID:  Matthew [name], DOB Jan 12, 1932, MRN [id number]  PCP:  Merlene Laughter, MD   [facility] Providers Cardiologist:  Donato Schultz, MD     Referring MD: Merlene Laughter, MD     History of Present Illness:    Matthew [name] is a 87 y.o. male here for the follow-up of complete heart block, pacemaker.  We always enjoyed talking about his grandsonWoodMyers, former Network engineer who played minor league in the [place] organization and finally moved onto potential coaching/recruiting positions. AAA/AA, spring training went well, cut him. A few weeks, [address], [place], talent scout, Copywriter, advertising.Married 11/2018.  Previously, Xarelto was decreased to 15 mg. Have a  Past Medical History:  Diagnosis Date  . Chronic venous insufficiency 02/06/2011  . Complete heart block (HCC) 07/20/2014   a. s/p STJ dual chamber PPM   . Family history of adverse reaction to anesthesia    "mother had carotid OR; they put her too deep; heart stopped in middle of operation; had to do CPR; broke a bunch of ribs; brother went too deep also; they had hard time gettin him out of it"  . Gilbert's syndrome   . Macular degeneration   . Paroxysmal atrial fibrillation (HCC) 07/20/2014  . Rosacea   . Thrombocytopenia (HCC)    Mild per Dr. Arbutus Ped  . Varicose veins of lower extremities with other complications 02/06/2011    Past Surgical History:  Procedure Laterality Date  . CARDIOVERSION  08/07/2011   Procedure: CARDIOVERSION;  Surgeon: Donato Schultz, MD;  Location: [facility] OR;  Service: Cardiovascular;  Laterality: N/A;  . ENDOVENOUS ABLATION SAPHENOUS VEIN W/ LASER Left 06-22-11   greater saphenous vein   . EP IMPLANTABLE DEVICE N/A 07/24/2014   STJ dual chamber PPM implanted by Dr Johney Frame for SSS  . EP IMPLANTABLE DEVICE N/A 07/25/2014   RA lead revision by Dr Ladona Ridgel  . KNEE ARTHROSCOPY Right   . TONSILLECTOMY    . VASECTOMY      Current  Medications: Current Meds  Medication Sig  . acetaminophen (TYLENOL) 500 MG tablet Take 500 mg by mouth as needed for mild pain.  . fluticasone (FLONASE) 50 MCG/ACT nasal spray Place 1-2 sprays into both nostrils at bedtime as needed (congestion).   . furosemide (LASIX) 40 MG tablet Take 1 tablet (40 mg total) by mouth daily as needed for fluid.  . Rivaroxaban (XARELTO) 15 MG TABS tablet TAKE 1 TABLET BY MOUTH ONCE DAILY WITH SUPPER  . sildenafil (VIAGRA) 100 MG tablet Take 100 mg by mouth daily as needed for erectile dysfunction.      Allergies:   Adhesive [tape] and Codeine   Social History   Socioeconomic History  . Marital status: Married    Spouse name: Not on file  . Number of children: Not on file  . Years of education: Not on file  . Highest education level: Not on file  Occupational History  . Not on file  Tobacco Use  . Smoking status: Never Smoker  . Smokeless tobacco: Never Used  Vaping Use  . Vaping Use: Never used  Substance and Sexual Activity  . Alcohol use: Yes    Comment: 07/24/2014 "might have 3 beers/month"  . Drug use: No  . Sexual activity: Yes  Other Topics Concern  . Not on file  Social History Narrative  . Not on file   Social Determinants of Health   Financial Resource  Strain: Not on file  Food Insecurity: Not on file  Transportation Needs: Not on file  Physical Activity: Not on file  Stress: Not on file  Social Connections: Not on file     Family History: The patient's family history includes Cancer in his brother and mother; Diabetes in his brother; Heart attack in his brother; Heart disease in his brother; Hypertension in his brother. There is no history of Stroke.  ROS:   Please see the history of present illness.     All other systems reviewed and are negative.  EKGs/Labs/Other Studies Reviewed:       EKG:  EKG is  ordered today.  The ekg ordered today demonstrates heart rate 60 atrial flutter underlying.  Paced.  Recent  Labs: No results found for requested labs within last 8760 hours.  Recent Lipid Panel No results found for: CHOL, TRIG, HDL, CHOLHDL, VLDL, LDLCALC, LDLDIRECT   Risk Assessment/Calculations:      Physical Exam:    VS:  BP 124/70 (BP Location: Left Arm, Patient Position: Sitting, Cuff Size: Normal)   Pulse 60   Ht 5\' 11"  (1.803 m)   Wt 175 lb (79.4 kg)   SpO2 97%   BMI 24.41 kg/m     Wt Readings from Last 3 Encounters:  06/10/20 175 lb (79.4 kg)  08/09/19 177 lb (80.3 kg)  05/10/19 176 lb 9.6 oz (80.1 kg)     GEN:  Well nourished, well developed in no acute distress HEENT: Normal NECK: No JVD; No carotid bruits LYMPHATICS: No lymphadenopathy CARDIAC: RRR, no murmurs, rubs, gallops RESPIRATORY:  Clear to auscultation without rales, wheezing or rhonchi  ABDOMEN: Soft, non-tender, non-distended MUSCULOSKELETAL:  No edema; No deformity  SKIN: Warm and dry NEUROLOGIC:  Alert and oriented x 3 PSYCHIATRIC:  Normal affect   ASSESSMENT:    1. Longstanding persistent atrial fibrillation (HCC)   2. Cardiac pacemaker in situ   3. Complete heart block (HCC)    PLAN:    In order of problems listed above:   Complete heart block -Pacemaker was placed in 2016, [place] [employer].  Longevity of battery.  EP has been following.   Atrial fibrillation/flutter - Currently paced.  Doing well.  Chronic anticoagulation - Xarelto 15 mg dose adjusted.  Hemoglobin 13.2 creatinine 1.3.  Dr. Effingham has been monitoring.  Medication refills as needed for medication management.  Essential hypertension -Well-controlled.  Doing well.  No changes made.  Memory impairment - Had seen Dr., Memory care unit in [place].  He has a sleep study upcoming.  They are contemplating moving to [facility].     Medication Adjustments/Labs and Tests Ordered: Current medicines are reviewed at length with the patient today.  Concerns regarding medicines are outlined above.  Orders Placed This Encounter   Procedures  . EKG 12-Lead   No orders of the defined types were placed in this encounter.   Patient Instructions  Medication Instructions:  The current medical regimen is effective;  continue present plan and medications.  *If you need a refill on your cardiac medications before your next appointment, please call your pharmacy*  Follow-Up: At [facility], you and your health needs are our priority.  As part of our continuing mission to provide you with exceptional heart care, we have created designated Provider Care Teams.  These Care Teams include your primary Cardiologist (physician) and Advanced Practice Providers (APPs -  Physician Assistants and Nurse Practitioners) who all work together to provide you with the care you need,  when you need it.  We recommend signing up for the patient portal called "MyChart".  Sign up information is provided on this After Visit Summary.  MyChart is used to connect with patients for Virtual Visits (Telemedicine).  Patients are able to view lab/test results, encounter notes, upcoming appointments, etc.  Non-urgent messages can be sent to your provider as well.   To learn more about what you can do with MyChart, go to [email or web address].    Your next appointment:   1 year(s)  The format for your next appointment:   In Person  Provider:   Donato Schultz, MD   Thank you for choosing [facility]!!         Signed, Donato Schultz, MD  06/10/2020 2:26 PM    [facility]

## 2020-06-26 DIAGNOSIS — R4182 Altered mental status, unspecified: Secondary | ICD-10-CM | POA: Diagnosis not present

## 2020-07-15 DIAGNOSIS — R2689 Other abnormalities of gait and mobility: Secondary | ICD-10-CM | POA: Diagnosis not present

## 2020-07-15 DIAGNOSIS — F0280 Dementia in other diseases classified elsewhere without behavioral disturbance: Secondary | ICD-10-CM | POA: Diagnosis not present

## 2020-07-15 DIAGNOSIS — G309 Alzheimer's disease, unspecified: Secondary | ICD-10-CM | POA: Diagnosis not present

## 2020-07-22 ENCOUNTER — Other Ambulatory Visit: Payer: Self-pay | Admitting: Physician Assistant

## 2020-07-23 NOTE — Telephone Encounter (Signed)
Prescription refill request for Xarelto received.   Indication: aflutter Last office visit: skains, 06/10/2020 Weight: 79.4 kg  Age: 87 yo  Scr: 1.3, 11/14/2019-via K[facility] CrCl: 45 ml/min   Pt is on the correct dose of Xarelto per dosing criteria, prescription refill sent for Xarelto 15mg  daily.

## 2020-07-29 ENCOUNTER — Ambulatory Visit (HOSPITAL_BASED_OUTPATIENT_CLINIC_OR_DEPARTMENT_OTHER): Payer: Medicare Other | Admitting: Internal Medicine

## 2020-07-30 ENCOUNTER — Ambulatory Visit (INDEPENDENT_AMBULATORY_CARE_PROVIDER_SITE_OTHER): Payer: Medicare Other

## 2020-07-30 DIAGNOSIS — I495 Sick sinus syndrome: Secondary | ICD-10-CM | POA: Diagnosis not present

## 2020-07-30 LAB — CUP PACEART REMOTE DEVICE CHECK
Battery Remaining Longevity: 61 mo
Battery Remaining Percentage: 42 %
Battery Voltage: 2.96 V
Brady Statistic RV Percent Paced: 99 %
Date Time Interrogation Session: 20220712020015
Implantable Lead Implant Date: 20160705
Implantable Lead Implant Date: 20160705
Implantable Lead Location: 753859
Implantable Lead Location: 753860
Implantable Lead Model: 1948
Implantable Pulse Generator Implant Date: 20160705
Lead Channel Impedance Value: 590 Ohm
Lead Channel Pacing Threshold Amplitude: 0.5 V
Lead Channel Pacing Threshold Pulse Width: 0.4 ms
Lead Channel Sensing Intrinsic Amplitude: 11.8 mV
Lead Channel Setting Pacing Amplitude: 0.75 V
Lead Channel Setting Pacing Pulse Width: 0.4 ms
Lead Channel Setting Sensing Sensitivity: 2 mV
Pulse Gen Model: 2240
Pulse Gen Serial Number: 7779900

## 2020-07-31 ENCOUNTER — Ambulatory Visit (HOSPITAL_BASED_OUTPATIENT_CLINIC_OR_DEPARTMENT_OTHER): Payer: Medicare Other | Attending: Internal Medicine | Admitting: Internal Medicine

## 2020-07-31 ENCOUNTER — Other Ambulatory Visit: Payer: Self-pay

## 2020-07-31 VITALS — Ht 71.0 in | Wt 170.0 lb

## 2020-07-31 DIAGNOSIS — G4710 Hypersomnia, unspecified: Secondary | ICD-10-CM | POA: Insufficient documentation

## 2020-07-31 DIAGNOSIS — R5383 Other fatigue: Secondary | ICD-10-CM | POA: Insufficient documentation

## 2020-07-31 DIAGNOSIS — R0683 Snoring: Secondary | ICD-10-CM | POA: Insufficient documentation

## 2020-07-31 DIAGNOSIS — G4733 Obstructive sleep apnea (adult) (pediatric): Secondary | ICD-10-CM

## 2020-08-01 DIAGNOSIS — G4733 Obstructive sleep apnea (adult) (pediatric): Secondary | ICD-10-CM | POA: Diagnosis not present

## 2020-08-19 DIAGNOSIS — F0390 Unspecified dementia without behavioral disturbance: Secondary | ICD-10-CM | POA: Diagnosis not present

## 2020-08-19 DIAGNOSIS — Z733 Stress, not elsewhere classified: Secondary | ICD-10-CM | POA: Diagnosis not present

## 2020-08-19 DIAGNOSIS — G309 Alzheimer's disease, unspecified: Secondary | ICD-10-CM | POA: Diagnosis not present

## 2020-08-19 NOTE — Procedures (Unsigned)
   NAME: Matthew [name] DATE OF BIRTH:  [birth year] MEDICAL RECORD NUMBER [id number]  LOCATION: [facility]  PHYSICIAN: Deretha Emory  DATE OF STUDY: 07/31/2020  SLEEP STUDY TYPE: Nocturnal Polysomnogram               REFERRING PHYSICIAN: Deretha Emory, MD  EPWORTH SLEEPINESS SCORE:  1 HEIGHT: 5\' 11"  (180.3 cm)  WEIGHT: 170 lb (77.1 kg)    Body mass index is 23.71 kg/m.  NECK SIZE: 15 in.  CLINICAL INFORMATION The patient was referred to the sleep center for PSG due to situational daytime sleepiness, snoring, atrial fibrillation and cognitive impairement He had two attempts at a HSAT that did not record for adequate time. They suggested that he would have at least mild OSA  MEDICATIONS Patient self administered medications include: none. No sleep medicine administered.  SLEEP STUDY TECHNIQUE A multi-channel overnight Polysomnography study was performed. The channels recorded and monitored were central and occipital EEG, electrooculogram (EOG), submentalis EMG (chin), nasal and oral airflow, thoracic and abdominal wall motion, anterior tibialis EMG, snore microphone, electrocardiogram, and a pulse oximetry.  TECHNICAL COMMENTS Comments added by Technician: PATIENT WAS ORDERED AS A NPSG ONLY. Patient had more than two awakenings to use the bathroom. PATIENT WAS NOTED TO URINATE ON HIS CLOTHING TWICE WHILE IN THE RESTROOM. Comments added by Scorer: N/A  SLEEP ARCHITECTURE The study was initiated at 11:21:08 PM and terminated at 5:30:35 AM. The total recorded time was 369.4 minutes. EEG confirmed total sleep time was 287 minutes yielding a sleep efficiency of 77.7%. Sleep onset after lights out was 14.8 minutes with a REM latency of 166.0 minutes. The patient spent 19.0% of the night in stage N1 sleep, 68.5%% in stage N2 sleep, 0.0% in stage N3 and 12.5% in REM. Wake after sleep onset (WASO) was 67.6 minutes. The Arousal Index was 20.5/hour.  RESPIRATORY  PARAMETERS There were a total of 43 respiratory disturbances out of which 7 were apneas ( 7 obstructive, 0 mixed, 0 central) and 36 hypopneas. The apnea/hypopnea index (AHI) was 9.0 events/hour. The central sleep apnea index was 0 events/hour. The REM AHI was 10.0 events/hour and NREM AHI was 8.8 events/hour. The supine AHI was 16.1 events/hour and the non supine AHI was 3.4 events per hour. The patient was supine during 44.09% of sleep. Respiratory disturbance index was 10.7 events/hour overall and 15 events per hour in REM sleep. Respiratory disturbances were associated with oxygen desaturation down to a nadir of 82.0% during sleep. The mean oxygen saturation during the study was 95.8%. The cumulative time under 88% oxygen saturation was 2 minutes.  LEG MOVEMENT DATA The total leg movements were 0 with a resulting leg movement index of 0.0/hr . Associated arousal with leg movement index was 0.0/hr.  CARDIAC DATA The underlying cardiac rhythm was most consistent with sinus rhythm. Mean heart rate during sleep was 60.8 bpm. Additional rhythm abnormalities include PVCs.  IMPRESSIONS - Mild Obstructive Sleep apnea (OSA) - Mild Oxygen Desaturation  DIAGNOSIS - Obstructive Sleep Apnea (G47.33) - Nocturnal Hypoxemia (G47.36)  RECOMMENDATIONS - Treatment could be considered, but the degree of sleep disordered breathing is mild as is his hypoxemia.   Marland Kitchen Sleep specialist, American Board of Internal Medicine  ELECTRONICALLY SIGNED ON:  08/19/2020, 8:15 PM [facility] PH: [phone]   FX: [phone] ACCREDITED BY THE AMERICAN ACADEMY OF SLEEP MEDICINE

## 2020-08-22 NOTE — Progress Notes (Signed)
Remote pacemaker transmission.   

## 2020-08-23 DIAGNOSIS — D696 Thrombocytopenia, unspecified: Secondary | ICD-10-CM | POA: Diagnosis not present

## 2020-08-23 DIAGNOSIS — R32 Unspecified urinary incontinence: Secondary | ICD-10-CM | POA: Diagnosis not present

## 2020-08-23 DIAGNOSIS — E538 Deficiency of other specified B group vitamins: Secondary | ICD-10-CM | POA: Diagnosis not present

## 2020-08-23 DIAGNOSIS — N1831 Chronic kidney disease, stage 3a: Secondary | ICD-10-CM | POA: Diagnosis not present

## 2020-09-27 DIAGNOSIS — I480 Paroxysmal atrial fibrillation: Secondary | ICD-10-CM | POA: Diagnosis not present

## 2020-09-27 DIAGNOSIS — N1831 Chronic kidney disease, stage 3a: Secondary | ICD-10-CM | POA: Diagnosis not present

## 2020-09-27 DIAGNOSIS — E538 Deficiency of other specified B group vitamins: Secondary | ICD-10-CM | POA: Diagnosis not present

## 2020-09-27 DIAGNOSIS — Z950 Presence of cardiac pacemaker: Secondary | ICD-10-CM | POA: Diagnosis not present

## 2020-09-27 DIAGNOSIS — D696 Thrombocytopenia, unspecified: Secondary | ICD-10-CM | POA: Diagnosis not present

## 2020-10-18 DIAGNOSIS — L570 Actinic keratosis: Secondary | ICD-10-CM | POA: Diagnosis not present

## 2020-10-18 DIAGNOSIS — D0462 Carcinoma in situ of skin of left upper limb, including shoulder: Secondary | ICD-10-CM | POA: Diagnosis not present

## 2020-10-18 DIAGNOSIS — Z85828 Personal history of other malignant neoplasm of skin: Secondary | ICD-10-CM | POA: Diagnosis not present

## 2020-10-18 DIAGNOSIS — L821 Other seborrheic keratosis: Secondary | ICD-10-CM | POA: Diagnosis not present

## 2020-10-29 ENCOUNTER — Ambulatory Visit (INDEPENDENT_AMBULATORY_CARE_PROVIDER_SITE_OTHER): Payer: Medicare Other

## 2020-10-29 DIAGNOSIS — I495 Sick sinus syndrome: Secondary | ICD-10-CM | POA: Diagnosis not present

## 2020-10-29 LAB — CUP PACEART REMOTE DEVICE CHECK
Battery Remaining Longevity: 58 mo
Battery Remaining Percentage: 40 %
Battery Voltage: 2.96 V
Brady Statistic RV Percent Paced: 99 %
Date Time Interrogation Session: 20221011020013
Implantable Lead Implant Date: 20160705
Implantable Lead Implant Date: 20160705
Implantable Lead Location: 753859
Implantable Lead Location: 753860
Implantable Lead Model: 1948
Implantable Pulse Generator Implant Date: 20160705
Lead Channel Impedance Value: 560 Ohm
Lead Channel Pacing Threshold Amplitude: 0.5 V
Lead Channel Pacing Threshold Pulse Width: 0.4 ms
Lead Channel Sensing Intrinsic Amplitude: 12 mV
Lead Channel Setting Pacing Amplitude: 0.75 V
Lead Channel Setting Pacing Pulse Width: 0.4 ms
Lead Channel Setting Sensing Sensitivity: 2 mV
Pulse Gen Model: 2240
Pulse Gen Serial Number: 7779900

## 2020-11-06 NOTE — Progress Notes (Signed)
Remote pacemaker transmission.   

## 2020-11-14 DIAGNOSIS — F0280 Dementia in other diseases classified elsewhere without behavioral disturbance: Secondary | ICD-10-CM | POA: Diagnosis not present

## 2020-11-14 DIAGNOSIS — G309 Alzheimer's disease, unspecified: Secondary | ICD-10-CM | POA: Diagnosis not present

## 2020-11-14 DIAGNOSIS — R413 Other amnesia: Secondary | ICD-10-CM | POA: Diagnosis not present

## 2020-11-14 DIAGNOSIS — R2689 Other abnormalities of gait and mobility: Secondary | ICD-10-CM | POA: Diagnosis not present

## 2020-11-20 DIAGNOSIS — E538 Deficiency of other specified B group vitamins: Secondary | ICD-10-CM | POA: Diagnosis not present

## 2020-11-20 DIAGNOSIS — Z79899 Other long term (current) drug therapy: Secondary | ICD-10-CM | POA: Diagnosis not present

## 2020-11-20 DIAGNOSIS — Z950 Presence of cardiac pacemaker: Secondary | ICD-10-CM | POA: Diagnosis not present

## 2020-11-20 DIAGNOSIS — N1831 Chronic kidney disease, stage 3a: Secondary | ICD-10-CM | POA: Diagnosis not present

## 2020-11-20 DIAGNOSIS — I480 Paroxysmal atrial fibrillation: Secondary | ICD-10-CM | POA: Diagnosis not present

## 2020-11-20 DIAGNOSIS — D696 Thrombocytopenia, unspecified: Secondary | ICD-10-CM | POA: Diagnosis not present

## 2020-11-20 DIAGNOSIS — Z0000 Encounter for general adult medical examination without abnormal findings: Secondary | ICD-10-CM | POA: Diagnosis not present

## 2020-12-30 ENCOUNTER — Telehealth: Payer: Self-pay | Admitting: Internal Medicine

## 2020-12-30 ENCOUNTER — Telehealth: Payer: Self-pay | Admitting: Cardiology

## 2020-12-30 NOTE — Telephone Encounter (Signed)
Left message to c/b to discuss ankle swelling concerns.

## 2020-12-30 NOTE — Telephone Encounter (Signed)
Pt c/o swelling: STAT is pt has developed SOB within 24 hours  How much weight have you gained and in what time span?   If swelling, where is the swelling located?  Bilateral ankle swelling  Are you currently taking a fluid pill? no  Are you currently SOB? NO  Do you have a log of your daily weights (if so, list)? NO  Have you gained 3 pounds in a day or 5 pounds in a week?   Have you traveled recently? NO

## 2020-12-30 NOTE — Telephone Encounter (Signed)
  1. Has your device fired? NO  2. Is you device beeping? NO  3. Are you experiencing draining or swelling at device site?   4. Are you calling to see if we received your device transmission? YES  5. Have you passed out? PT'S WIFE WALKED INTO THE ROOM Saturday AND NOTICED THE LIGHTS ON THE DEVICE WERE NOT WORKING.    Please route to Device Clinic Pool

## 2020-12-31 NOTE — Telephone Encounter (Signed)
No answer no voice mail  

## 2020-12-31 NOTE — Telephone Encounter (Signed)
Was able to reach pt who reports his phone was out of service yesterday. In regards to his bilateral ankle swelling pt reports he does not feel as though they are swollen but his wife does.  He denies any SOB, wts daily and wts stay between 170 to 175 lbs.  He admits to eating a lot of corn chips lately and state he knows he needs to watch his salt intake.  He has Furosemide 40 mg prn but has not taken it because he doesn't believe it's necessary.  He will continue to monitor both the edema and  his weights. Advised to elevate feet/legs during the day while sitting above the heart of his heart.  Also advised of wearing knee high compression stockings daily.  He states understanding and will call back if any further needs or concerns.

## 2021-01-15 ENCOUNTER — Other Ambulatory Visit: Payer: Self-pay | Admitting: Physician Assistant

## 2021-01-15 NOTE — Telephone Encounter (Signed)
Pt last saw Dr Anne Fu 06/10/20, last labs 11/20/20 Creat 1.42 at [facility] per [facility], age 87, weight 77.1kg, CrCl 39.79, based on CrCl pt is on appropriate dosage of Xarelto 15mg  QD for afib.  Will refill rx.

## 2021-01-28 ENCOUNTER — Ambulatory Visit (INDEPENDENT_AMBULATORY_CARE_PROVIDER_SITE_OTHER): Payer: Medicare Other

## 2021-01-28 DIAGNOSIS — I495 Sick sinus syndrome: Secondary | ICD-10-CM

## 2021-01-28 LAB — CUP PACEART REMOTE DEVICE CHECK
Battery Remaining Longevity: 55 mo
Battery Remaining Percentage: 38 %
Battery Voltage: 2.96 V
Brady Statistic RV Percent Paced: 99 %
Date Time Interrogation Session: 20230110020020
Implantable Lead Implant Date: 20160705
Implantable Lead Implant Date: 20160705
Implantable Lead Location: 753859
Implantable Lead Location: 753860
Implantable Lead Model: 1948
Implantable Pulse Generator Implant Date: 20160705
Lead Channel Impedance Value: 560 Ohm
Lead Channel Pacing Threshold Amplitude: 0.5 V
Lead Channel Pacing Threshold Pulse Width: 0.4 ms
Lead Channel Sensing Intrinsic Amplitude: 9.9 mV
Lead Channel Setting Pacing Amplitude: 0.75 V
Lead Channel Setting Pacing Pulse Width: 0.4 ms
Lead Channel Setting Sensing Sensitivity: 2 mV
Pulse Gen Model: 2240
Pulse Gen Serial Number: 7779900

## 2021-02-06 NOTE — Progress Notes (Signed)
Remote pacemaker transmission.   

## 2021-04-29 ENCOUNTER — Ambulatory Visit (INDEPENDENT_AMBULATORY_CARE_PROVIDER_SITE_OTHER): Payer: Medicare Other

## 2021-04-29 DIAGNOSIS — I495 Sick sinus syndrome: Secondary | ICD-10-CM | POA: Diagnosis not present

## 2021-04-29 LAB — CUP PACEART REMOTE DEVICE CHECK
Battery Remaining Longevity: 52 mo
Battery Remaining Percentage: 36 %
Battery Voltage: 2.96 V
Brady Statistic RV Percent Paced: 99 %
Date Time Interrogation Session: 20230411020024
Implantable Lead Implant Date: 20160705
Implantable Lead Implant Date: 20160705
Implantable Lead Location: 753859
Implantable Lead Location: 753860
Implantable Lead Model: 1948
Implantable Pulse Generator Implant Date: 20160705
Lead Channel Impedance Value: 540 Ohm
Lead Channel Pacing Threshold Amplitude: 0.5 V
Lead Channel Pacing Threshold Pulse Width: 0.4 ms
Lead Channel Sensing Intrinsic Amplitude: 10.9 mV
Lead Channel Setting Pacing Amplitude: 0.75 V
Lead Channel Setting Pacing Pulse Width: 0.4 ms
Lead Channel Setting Sensing Sensitivity: 2 mV
Pulse Gen Model: 2240
Pulse Gen Serial Number: 7779900

## 2021-05-15 NOTE — Progress Notes (Signed)
Remote pacemaker transmission.   

## 2021-05-26 DIAGNOSIS — Z950 Presence of cardiac pacemaker: Secondary | ICD-10-CM | POA: Diagnosis not present

## 2021-05-26 DIAGNOSIS — D6869 Other thrombophilia: Secondary | ICD-10-CM | POA: Diagnosis not present

## 2021-05-26 DIAGNOSIS — I480 Paroxysmal atrial fibrillation: Secondary | ICD-10-CM | POA: Diagnosis not present

## 2021-05-26 DIAGNOSIS — N1831 Chronic kidney disease, stage 3a: Secondary | ICD-10-CM | POA: Diagnosis not present

## 2021-06-17 DIAGNOSIS — R197 Diarrhea, unspecified: Secondary | ICD-10-CM | POA: Diagnosis not present

## 2021-06-17 DIAGNOSIS — R350 Frequency of micturition: Secondary | ICD-10-CM | POA: Diagnosis not present

## 2021-06-17 DIAGNOSIS — Z87438 Personal history of other diseases of male genital organs: Secondary | ICD-10-CM | POA: Diagnosis not present

## 2021-07-22 ENCOUNTER — Other Ambulatory Visit: Payer: Self-pay | Admitting: Cardiology

## 2021-07-23 NOTE — Telephone Encounter (Addendum)
Xarelto 15mg  refill request received. Pt is 88 years old, weight-77.1kg, Crea-1.43 on 11/09/2020 via [facility] from [place], last seen by Dr. Natrona heights on 06/10/2020-PT NEEDS AN APPT, Diagnosis-Afib, CrCl-39.79ml/min; Dose is appropriate based on dosing criteria.   PT NEEDS AN APPT; SENDING MESSAGE TO SCHEDULERS.   Schedulers left a message for the pt to call back to get scheduled.  07/23/2021 at 508pm, called pt and left a message to call back regarding an appt.  07/24/2021 at 958am, called pt and left a message to call back regarding an appt and refill request we have received.  7/7/203 left message for pt to call back regarding appt and refill request.

## 2021-08-04 ENCOUNTER — Ambulatory Visit (INDEPENDENT_AMBULATORY_CARE_PROVIDER_SITE_OTHER): Payer: Medicare Other

## 2021-08-04 DIAGNOSIS — L905 Scar conditions and fibrosis of skin: Secondary | ICD-10-CM | POA: Diagnosis not present

## 2021-08-04 DIAGNOSIS — L820 Inflamed seborrheic keratosis: Secondary | ICD-10-CM | POA: Diagnosis not present

## 2021-08-04 DIAGNOSIS — I495 Sick sinus syndrome: Secondary | ICD-10-CM | POA: Diagnosis not present

## 2021-08-04 DIAGNOSIS — L570 Actinic keratosis: Secondary | ICD-10-CM | POA: Diagnosis not present

## 2021-08-04 DIAGNOSIS — Z85828 Personal history of other malignant neoplasm of skin: Secondary | ICD-10-CM | POA: Diagnosis not present

## 2021-08-04 DIAGNOSIS — D485 Neoplasm of uncertain behavior of skin: Secondary | ICD-10-CM | POA: Diagnosis not present

## 2021-08-06 LAB — CUP PACEART REMOTE DEVICE CHECK
Battery Remaining Longevity: 49 mo
Battery Remaining Percentage: 34 %
Battery Voltage: 2.96 V
Brady Statistic RV Percent Paced: 99 %
Date Time Interrogation Session: 20230714010216
Implantable Lead Implant Date: 20160705
Implantable Lead Implant Date: 20160705
Implantable Lead Location: 753859
Implantable Lead Location: 753860
Implantable Lead Model: 1948
Implantable Pulse Generator Implant Date: 20160705
Lead Channel Impedance Value: 510 Ohm
Lead Channel Pacing Threshold Amplitude: 0.625 V
Lead Channel Pacing Threshold Pulse Width: 0.4 ms
Lead Channel Sensing Intrinsic Amplitude: 10.4 mV
Lead Channel Setting Pacing Amplitude: 0.875
Lead Channel Setting Pacing Pulse Width: 0.4 ms
Lead Channel Setting Sensing Sensitivity: 2 mV
Pulse Gen Model: 2240
Pulse Gen Serial Number: 7779900

## 2021-08-14 ENCOUNTER — Telehealth: Payer: Self-pay | Admitting: Cardiology

## 2021-08-14 DIAGNOSIS — I4811 Longstanding persistent atrial fibrillation: Secondary | ICD-10-CM

## 2021-08-14 MED ORDER — RIVAROXABAN 15 MG PO TABS: 15.0000 mg | ORAL_TABLET | Freq: Every day | ORAL | 0 refills | Status: DC

## 2021-08-14 NOTE — Telephone Encounter (Signed)
Prescription refill request for Xarelto received.  Indication: Afib  Last office visit:06/10/20 (Skains) Weight: 77.1kg Age: 88 Scr: 1.43 (11/20/20 via [facility]) CrCl: 39.63ml/min  Pt overdue for office visit. Pt has appt on  09/05/21 with Dr Anne Fu. Appropriate dose and refill sent to requested pharmacy.

## 2021-08-14 NOTE — Telephone Encounter (Signed)
*  STAT* If patient is at the pharmacy, call can be transferred to refill team.   1. Which medications need to be refilled? (please list name of each medication and dose if known) Rivaroxaban (XARELTO) 15 MG TABS tablet  2. Which pharmacy/location (including street and city if local pharmacy) is medication to be sent to? [facility] [address], [place] - [address]  3. Do they need a 30 day or 90 day supply? 30 day  Patient has appt on 09/05/21

## 2021-09-05 ENCOUNTER — Encounter: Payer: Self-pay | Admitting: Cardiology

## 2021-09-05 ENCOUNTER — Ambulatory Visit: Payer: Medicare Other | Admitting: Cardiology

## 2021-09-05 VITALS — BP 100/70 | HR 60 | Ht 71.0 in | Wt 171.0 lb

## 2021-09-05 DIAGNOSIS — I4811 Longstanding persistent atrial fibrillation: Secondary | ICD-10-CM | POA: Diagnosis not present

## 2021-09-05 DIAGNOSIS — I442 Atrioventricular block, complete: Secondary | ICD-10-CM

## 2021-09-05 DIAGNOSIS — Z950 Presence of cardiac pacemaker: Secondary | ICD-10-CM | POA: Diagnosis not present

## 2021-09-05 NOTE — Progress Notes (Signed)
Cardiology Office Note:    Date:  09/05/2021   ID:  Matthew [name], DOB 1931-08-24, MRN [id number]  PCP:  Merlene Laughter, MD   [facility] Providers Cardiologist:  Donato Schultz, MD     Referring MD: Merlene Laughter, MD     History of Present Illness:    Matthew [name] is a 88 y.o. male here for the follow-up of complete heart block, pacemaker.  On 07/24/2014 he underwent successful implantation of a [employer] DR dual-chamber pacemaker for symptomatic sick sinus syndrome. Remote device interrogation 08/04/2021 showed normal device function, 49 month battery longevity,  We always enjoyed talking about his grandson Bebe Shaggy, former [employer] baseball player who played minor league in the [employer] organization and finally moved onto potential coaching/recruiting positions. AAA/AA, spring training went well, cut him. A few weeks, [address] [place], talent scout, Copywriter, advertising. Married 11/2018.    Previously, Xarelto was decreased to 15 mg.  Today: He is accompanied by his wife, Matthew [name], who is also a patient of mine. They both have appointments scheduled today and wish to be seen together.  A few times he has felt "like the throttle was cut in half". He felt some shortness of breath and fatigue.  He denies any chest pain, or peripheral edema. No lightheadedness, headaches, syncope, orthopnea, or PND.  They are now scheduled to move to [place] in the fall.   Past Medical History:  Diagnosis Date   Chronic venous insufficiency 02/06/2011   Complete heart block (HCC) 07/20/2014   a. s/p STJ dual chamber PPM    Family history of adverse reaction to anesthesia    "mother had carotid OR; they put her too deep; heart stopped in middle of operation; had to do CPR; broke a bunch of ribs; brother went too deep also; they had hard time gettin him out of it"   Gilbert's syndrome    Macular degeneration    Paroxysmal atrial fibrillation (HCC) 07/20/2014   Rosacea     Thrombocytopenia (HCC)    Mild per Dr. Arbutus Ped   Varicose veins of lower extremities with other complications 02/06/2011    Past Surgical History:  Procedure Laterality Date   CARDIOVERSION  08/07/2011   Procedure: CARDIOVERSION;  Surgeon: Donato Schultz, MD;  Location: [facility] OR;  Service: Cardiovascular;  Laterality: N/A;   ENDOVENOUS ABLATION SAPHENOUS VEIN W/ LASER Left 06-22-11   greater saphenous vein    EP IMPLANTABLE DEVICE N/A 07/24/2014   STJ dual chamber PPM implanted by Dr Johney Frame for SSS   EP IMPLANTABLE DEVICE N/A 07/25/2014   RA lead revision by Dr Ladona Ridgel   KNEE ARTHROSCOPY Right    TONSILLECTOMY     VASECTOMY      Current Medications: Current Meds  Medication Sig   acetaminophen (TYLENOL) 500 MG tablet Take 500 mg by mouth as needed for mild pain.   fluticasone (FLONASE) 50 MCG/ACT nasal spray Place 1-2 sprays into both nostrils at bedtime as needed (congestion).    furosemide (LASIX) 40 MG tablet Take 1 tablet (40 mg total) by mouth daily as needed for fluid.   Rivaroxaban (XARELTO) 15 MG TABS tablet Take 1 tablet (15 mg total) by mouth daily with supper.   sildenafil (VIAGRA) 100 MG tablet Take 100 mg by mouth daily as needed for erectile dysfunction.      Allergies:   Adhesive [tape] and Codeine   Social History   Socioeconomic History   Marital status: Married  Spouse name: Not on file   Number of children: Not on file   Years of education: Not on file   Highest education level: Not on file  Occupational History   Not on file  Tobacco Use   Smoking status: Never   Smokeless tobacco: Never  Vaping Use   Vaping Use: Never used  Substance and Sexual Activity   Alcohol use: Yes    Comment: 07/24/2014 "might have 3 beers/month"   Drug use: No   Sexual activity: Yes  Other Topics Concern   Not on file  Social History Narrative   Not on file   Social Determinants of Health   Financial Resource Strain: Not on file  Food Insecurity: Not on file  Transportation  Needs: Not on file  Physical Activity: Not on file  Stress: Not on file  Social Connections: Not on file     Family History: The patient's family history includes Cancer in his brother and mother; Diabetes in his brother; Heart attack in his brother; Heart disease in his brother; Hypertension in his brother. There is no history of Stroke.  ROS:   Please see the history of present illness.    (+) Fatigue (+) Shortness of breath All other systems reviewed and are negative.  EKGs/Labs/Other Studies Reviewed:    Echo  07/24/2014: Study Conclusions   - Left ventricle: The cavity size was normal. Wall thickness was    increased in a pattern of mild LVH. Systolic function was normal.    The estimated ejection fraction was in the range of 60% to 65%.    Wall motion was normal; there were no regional wall motion    abnormalities. The study is not technically sufficient to allow    evaluation of LV diastolic function.  - Aortic valve: There was trivial regurgitation.  - Mitral valve: There was moderate regurgitation.  - Left atrium: The atrium was mildly dilated.  - Right atrium: The atrium was moderately to severely dilated.  - Tricuspid valve: There was mild-moderate regurgitation.  - Pulmonary arteries: PA peak pressure: 42 mm Hg (S).    EKG:  EKG is personally reviewed. 09/05/2021: Slow atrial flutter, ventricular pacing.  06/10/2020:  heart rate 60 atrial flutter underlying.  Paced.  Recent Labs: No results found for requested labs within last 365 days.   Recent Lipid Panel No results found for: "CHOL", "TRIG", "HDL", "CHOLHDL", "VLDL", "LDLCALC", "LDLDIRECT"   Risk Assessment/Calculations:      Physical Exam:    VS:  BP 100/70 (BP Location: Left Arm, Patient Position: Sitting, Cuff Size: Normal)   Pulse 60   Ht 5\' 11"  (1.803 m)   Wt 171 lb (77.6 kg)   SpO2 96%   BMI 23.85 kg/m     Wt Readings from Last 3 Encounters:  09/05/21 171 lb (77.6 kg)  07/31/20 170 lb  (77.1 kg)  06/10/20 175 lb (79.4 kg)     GEN:  Well nourished, well developed in no acute distress HEENT: Normal NECK: No JVD; No carotid bruits LYMPHATICS: No lymphadenopathy CARDIAC: RRR, no murmurs, rubs, gallops RESPIRATORY:  Clear to auscultation without rales, wheezing or rhonchi  ABDOMEN: Soft, non-tender, non-distended MUSCULOSKELETAL:  No edema; No deformity  SKIN: Warm and dry NEUROLOGIC:  Alert and oriented x 3 PSYCHIATRIC:  Normal affect   ASSESSMENT:    1. Longstanding persistent atrial fibrillation (HCC)   2. Cardiac pacemaker in situ   3. Complete heart block (HCC)     PLAN:  In order of problems listed above:   Complete heart block -Pacemaker was placed in 2016, [employer] Jude.  Longevity of battery.  EP has been following.  Ventricular pacing noted on ECG.  Stable.    Atrial fibrillation/flutter - Currently paced.  Doing well.  Underlying atrial tachycardia/atrial flutter noted on ECG.  Chronic anticoagulation - Xarelto 15 mg dose adjusted.  Prior hemoglobin 13.2 creatinine 1.3.  Dr. Pete Glatter has been monitoring.  Medication refills as needed for medication management.  Essential hypertension -Well-controlled.  Doing well.  No changes.  Memory impairment - Is going to be moving to [place] to be with family.      Follow-up:  1 year.    Medication Adjustments/Labs and Tests Ordered: Current medicines are reviewed at length with the patient today.  Concerns regarding medicines are outlined above.   Orders Placed This Encounter  Procedures   EKG 12-Lead   No orders of the defined types were placed in this encounter.  Patient Instructions  Medication Instructions:  The current medical regimen is effective;  continue present plan and medications.  *If you need a refill on your cardiac medications before your next appointment, please call your pharmacy*  Follow-Up: At [facility], you and your health needs are our priority.  As  part of our continuing mission to provide you with exceptional heart care, we have created designated Provider Care Teams.  These Care Teams include your primary Cardiologist (physician) and Advanced Practice Providers (APPs -  Physician Assistants and Nurse Practitioners) who all work together to provide you with the care you need, when you need it.  We recommend signing up for the patient portal called "MyChart".  Sign up information is provided on this After Visit Summary.  MyChart is used to connect with patients for Virtual Visits (Telemedicine).  Patients are able to view lab/test results, encounter notes, upcoming appointments, etc.  Non-urgent messages can be sent to your provider as well.   To learn more about what you can do with MyChart, go to [email or web address].    Your next appointment:   1 year(s)  The format for your next appointment:   In Person  Provider:   Donato Schultz, MD {   Important Information About Sugar         I,Mathew Stumpf,acting as a scribe for Donato Schultz, MD.,have documented all relevant documentation on the behalf of Donato Schultz, MD,as directed by  Donato Schultz, MD while in the presence of Donato Schultz, MD.  I, Donato Schultz, MD, have reviewed all documentation for this visit. The documentation on 09/05/21 for the exam, diagnosis, procedures, and orders are all accurate and complete.   Signed, Donato Schultz, MD  09/05/2021 5:04 PM    [facility]

## 2021-09-05 NOTE — Patient Instructions (Signed)

## 2021-09-09 ENCOUNTER — Other Ambulatory Visit: Payer: Self-pay | Admitting: Cardiology

## 2021-09-09 DIAGNOSIS — I4811 Longstanding persistent atrial fibrillation: Secondary | ICD-10-CM

## 2021-09-09 NOTE — Progress Notes (Signed)
Remote pacemaker transmission.   

## 2021-09-09 NOTE — Telephone Encounter (Signed)
Prescription refill request for Xarelto received.  Indication:Afib Last office visit:8/23 Weight:77.6 kg Age:88 Scr:1.4 CrCl:40.8 ml/min  Prescription refilled

## 2021-09-12 DIAGNOSIS — H26493 Other secondary cataract, bilateral: Secondary | ICD-10-CM | POA: Diagnosis not present

## 2021-09-12 DIAGNOSIS — H524 Presbyopia: Secondary | ICD-10-CM | POA: Diagnosis not present

## 2021-09-12 DIAGNOSIS — H04123 Dry eye syndrome of bilateral lacrimal glands: Secondary | ICD-10-CM | POA: Diagnosis not present

## 2021-09-12 DIAGNOSIS — Z961 Presence of intraocular lens: Secondary | ICD-10-CM | POA: Diagnosis not present

## 2021-10-21 DIAGNOSIS — R3121 Asymptomatic microscopic hematuria: Secondary | ICD-10-CM | POA: Diagnosis not present

## 2021-10-21 DIAGNOSIS — R350 Frequency of micturition: Secondary | ICD-10-CM | POA: Diagnosis not present

## 2021-10-21 DIAGNOSIS — R351 Nocturia: Secondary | ICD-10-CM | POA: Diagnosis not present

## 2021-10-21 DIAGNOSIS — N3944 Nocturnal enuresis: Secondary | ICD-10-CM | POA: Diagnosis not present

## 2021-11-03 ENCOUNTER — Ambulatory Visit (INDEPENDENT_AMBULATORY_CARE_PROVIDER_SITE_OTHER): Payer: Medicare Other

## 2021-11-03 DIAGNOSIS — I495 Sick sinus syndrome: Secondary | ICD-10-CM

## 2021-11-03 LAB — CUP PACEART REMOTE DEVICE CHECK
Battery Remaining Longevity: 46 mo
Battery Remaining Percentage: 32 %
Battery Voltage: 2.95 V
Brady Statistic RV Percent Paced: 99 %
Date Time Interrogation Session: 20231016034703
Implantable Lead Implant Date: 20160705
Implantable Lead Implant Date: 20160705
Implantable Lead Location: 753859
Implantable Lead Location: 753860
Implantable Lead Model: 1948
Implantable Pulse Generator Implant Date: 20160705
Lead Channel Impedance Value: 560 Ohm
Lead Channel Pacing Threshold Amplitude: 0.5 V
Lead Channel Pacing Threshold Pulse Width: 0.4 ms
Lead Channel Sensing Intrinsic Amplitude: 12 mV
Lead Channel Setting Pacing Amplitude: 0.75 V
Lead Channel Setting Pacing Pulse Width: 0.4 ms
Lead Channel Setting Sensing Sensitivity: 2 mV
Pulse Gen Model: 2240
Pulse Gen Serial Number: 7779900

## 2021-11-11 DIAGNOSIS — R3129 Other microscopic hematuria: Secondary | ICD-10-CM | POA: Diagnosis not present

## 2021-11-11 DIAGNOSIS — R3121 Asymptomatic microscopic hematuria: Secondary | ICD-10-CM | POA: Diagnosis not present

## 2021-11-20 NOTE — Progress Notes (Signed)
Remote pacemaker transmission.   

## 2021-12-17 DIAGNOSIS — R3121 Asymptomatic microscopic hematuria: Secondary | ICD-10-CM | POA: Diagnosis not present

## 2021-12-17 DIAGNOSIS — N3944 Nocturnal enuresis: Secondary | ICD-10-CM | POA: Diagnosis not present

## 2021-12-19 DIAGNOSIS — D61811 Other drug-induced pancytopenia: Secondary | ICD-10-CM | POA: Diagnosis not present

## 2021-12-19 DIAGNOSIS — D696 Thrombocytopenia, unspecified: Secondary | ICD-10-CM | POA: Diagnosis not present

## 2021-12-19 DIAGNOSIS — I480 Paroxysmal atrial fibrillation: Secondary | ICD-10-CM | POA: Diagnosis not present

## 2021-12-19 DIAGNOSIS — Z0000 Encounter for general adult medical examination without abnormal findings: Secondary | ICD-10-CM | POA: Diagnosis not present

## 2021-12-19 DIAGNOSIS — Z79899 Other long term (current) drug therapy: Secondary | ICD-10-CM | POA: Diagnosis not present

## 2021-12-19 DIAGNOSIS — Z950 Presence of cardiac pacemaker: Secondary | ICD-10-CM | POA: Diagnosis not present

## 2021-12-19 DIAGNOSIS — E538 Deficiency of other specified B group vitamins: Secondary | ICD-10-CM | POA: Diagnosis not present

## 2021-12-19 DIAGNOSIS — Z1331 Encounter for screening for depression: Secondary | ICD-10-CM | POA: Diagnosis not present

## 2022-01-09 ENCOUNTER — Other Ambulatory Visit: Payer: Self-pay

## 2022-01-09 DIAGNOSIS — I4811 Longstanding persistent atrial fibrillation: Secondary | ICD-10-CM

## 2022-01-09 MED ORDER — RIVAROXABAN 15 MG PO TABS: 15.0000 mg | ORAL_TABLET | Freq: Every day | ORAL | 3 refills | Status: DC

## 2022-01-20 ENCOUNTER — Other Ambulatory Visit: Payer: Self-pay | Admitting: *Deleted

## 2022-01-20 DIAGNOSIS — I4811 Longstanding persistent atrial fibrillation: Secondary | ICD-10-CM

## 2022-01-20 MED ORDER — RIVAROXABAN 15 MG PO TABS: 15.0000 mg | ORAL_TABLET | Freq: Every day | ORAL | 3 refills | Status: DC

## 2022-01-20 MED ORDER — FUROSEMIDE 40 MG PO TABS: 40.0000 mg | ORAL_TABLET | Freq: Every day | ORAL | 2 refills | Status: AC | PRN

## 2022-01-20 NOTE — Telephone Encounter (Signed)
Prescription refill request for Xarelto received.  Indication: Afib  Last office visit: 09/05/21 Matthew [name])  Weight: 77.6kg Age: 89 Scr: 1.39 (12/19/21 via [facility])  CrCl: 40.6ml/min  Appropriate dose and refill sent to requested pharmacy.

## 2022-01-29 DIAGNOSIS — G4733 Obstructive sleep apnea (adult) (pediatric): Secondary | ICD-10-CM | POA: Diagnosis not present

## 2022-01-29 DIAGNOSIS — R413 Other amnesia: Secondary | ICD-10-CM | POA: Diagnosis not present

## 2022-01-29 DIAGNOSIS — N3944 Nocturnal enuresis: Secondary | ICD-10-CM | POA: Diagnosis not present

## 2022-01-29 DIAGNOSIS — F03B2 Unspecified dementia, moderate, with psychotic disturbance: Secondary | ICD-10-CM | POA: Diagnosis not present

## 2022-02-02 ENCOUNTER — Ambulatory Visit (INDEPENDENT_AMBULATORY_CARE_PROVIDER_SITE_OTHER): Payer: Medicare Other

## 2022-02-02 DIAGNOSIS — I495 Sick sinus syndrome: Secondary | ICD-10-CM | POA: Diagnosis not present

## 2022-02-03 LAB — CUP PACEART REMOTE DEVICE CHECK
Battery Remaining Longevity: 43 mo
Battery Remaining Percentage: 30 %
Battery Voltage: 2.95 V
Brady Statistic RV Percent Paced: 99 %
Date Time Interrogation Session: 20240115020012
Implantable Lead Connection Status: 753985
Implantable Lead Connection Status: 753985
Implantable Lead Implant Date: 20160705
Implantable Lead Implant Date: 20160705
Implantable Lead Location: 753859
Implantable Lead Location: 753860
Implantable Lead Model: 1948
Implantable Pulse Generator Implant Date: 20160705
Lead Channel Impedance Value: 580 Ohm
Lead Channel Pacing Threshold Amplitude: 0.375 V
Lead Channel Pacing Threshold Pulse Width: 0.4 ms
Lead Channel Sensing Intrinsic Amplitude: 12 mV
Lead Channel Setting Pacing Amplitude: 0.625
Lead Channel Setting Pacing Pulse Width: 0.4 ms
Lead Channel Setting Sensing Sensitivity: 2 mV
Pulse Gen Model: 2240
Pulse Gen Serial Number: 7779900

## 2022-03-12 NOTE — Progress Notes (Signed)
Remote pacemaker transmission.   

## 2022-04-28 DIAGNOSIS — R600 Localized edema: Secondary | ICD-10-CM | POA: Diagnosis not present

## 2022-04-28 DIAGNOSIS — G300 Alzheimer's disease with early onset: Secondary | ICD-10-CM | POA: Diagnosis not present

## 2022-04-28 DIAGNOSIS — G4733 Obstructive sleep apnea (adult) (pediatric): Secondary | ICD-10-CM | POA: Diagnosis not present

## 2022-04-28 DIAGNOSIS — R32 Unspecified urinary incontinence: Secondary | ICD-10-CM | POA: Diagnosis not present

## 2022-04-28 DIAGNOSIS — I4891 Unspecified atrial fibrillation: Secondary | ICD-10-CM | POA: Diagnosis not present

## 2022-05-04 ENCOUNTER — Ambulatory Visit (INDEPENDENT_AMBULATORY_CARE_PROVIDER_SITE_OTHER): Payer: Medicare Other

## 2022-05-04 DIAGNOSIS — I495 Sick sinus syndrome: Secondary | ICD-10-CM

## 2022-05-04 LAB — CUP PACEART REMOTE DEVICE CHECK
Battery Remaining Longevity: 40 mo
Battery Remaining Percentage: 28 %
Battery Voltage: 2.93 V
Brady Statistic RV Percent Paced: 99 %
Date Time Interrogation Session: 20240415020027
Implantable Lead Connection Status: 753985
Implantable Lead Connection Status: 753985
Implantable Lead Implant Date: 20160705
Implantable Lead Implant Date: 20160705
Implantable Lead Location: 753859
Implantable Lead Location: 753860
Implantable Lead Model: 1948
Implantable Pulse Generator Implant Date: 20160705
Lead Channel Impedance Value: 560 Ohm
Lead Channel Pacing Threshold Amplitude: 0.5 V
Lead Channel Pacing Threshold Pulse Width: 0.4 ms
Lead Channel Sensing Intrinsic Amplitude: 12 mV
Lead Channel Setting Pacing Amplitude: 0.75 V
Lead Channel Setting Pacing Pulse Width: 0.4 ms
Lead Channel Setting Sensing Sensitivity: 2 mV
Pulse Gen Model: 2240
Pulse Gen Serial Number: 7779900

## 2022-05-05 DIAGNOSIS — I4891 Unspecified atrial fibrillation: Secondary | ICD-10-CM | POA: Diagnosis not present

## 2022-05-05 DIAGNOSIS — G4733 Obstructive sleep apnea (adult) (pediatric): Secondary | ICD-10-CM | POA: Diagnosis not present

## 2022-05-05 DIAGNOSIS — G300 Alzheimer's disease with early onset: Secondary | ICD-10-CM | POA: Diagnosis not present

## 2022-05-05 DIAGNOSIS — R600 Localized edema: Secondary | ICD-10-CM | POA: Diagnosis not present

## 2022-05-05 DIAGNOSIS — R32 Unspecified urinary incontinence: Secondary | ICD-10-CM | POA: Diagnosis not present

## 2022-05-12 DIAGNOSIS — D649 Anemia, unspecified: Secondary | ICD-10-CM | POA: Diagnosis not present

## 2022-05-12 DIAGNOSIS — I4891 Unspecified atrial fibrillation: Secondary | ICD-10-CM | POA: Diagnosis not present

## 2022-05-12 DIAGNOSIS — G4733 Obstructive sleep apnea (adult) (pediatric): Secondary | ICD-10-CM | POA: Diagnosis not present

## 2022-05-12 DIAGNOSIS — G300 Alzheimer's disease with early onset: Secondary | ICD-10-CM | POA: Diagnosis not present

## 2022-05-12 DIAGNOSIS — R319 Hematuria, unspecified: Secondary | ICD-10-CM | POA: Diagnosis not present

## 2022-05-12 DIAGNOSIS — N289 Disorder of kidney and ureter, unspecified: Secondary | ICD-10-CM | POA: Diagnosis not present

## 2022-05-12 DIAGNOSIS — D696 Thrombocytopenia, unspecified: Secondary | ICD-10-CM | POA: Diagnosis not present

## 2022-05-12 DIAGNOSIS — R600 Localized edema: Secondary | ICD-10-CM | POA: Diagnosis not present

## 2022-05-12 DIAGNOSIS — E0590 Thyrotoxicosis, unspecified without thyrotoxic crisis or storm: Secondary | ICD-10-CM | POA: Diagnosis not present

## 2022-05-12 DIAGNOSIS — R32 Unspecified urinary incontinence: Secondary | ICD-10-CM | POA: Diagnosis not present

## 2022-06-04 NOTE — Progress Notes (Signed)
Remote pacemaker transmission.   

## 2022-07-20 DIAGNOSIS — E039 Hypothyroidism, unspecified: Secondary | ICD-10-CM | POA: Diagnosis not present

## 2022-07-20 DIAGNOSIS — N1832 Chronic kidney disease, stage 3b: Secondary | ICD-10-CM | POA: Diagnosis not present

## 2022-07-30 DIAGNOSIS — G300 Alzheimer's disease with early onset: Secondary | ICD-10-CM | POA: Diagnosis not present

## 2022-07-30 DIAGNOSIS — E039 Hypothyroidism, unspecified: Secondary | ICD-10-CM | POA: Diagnosis not present

## 2022-07-30 DIAGNOSIS — N1832 Chronic kidney disease, stage 3b: Secondary | ICD-10-CM | POA: Diagnosis not present

## 2022-07-30 DIAGNOSIS — I4891 Unspecified atrial fibrillation: Secondary | ICD-10-CM | POA: Diagnosis not present

## 2022-08-03 ENCOUNTER — Ambulatory Visit (INDEPENDENT_AMBULATORY_CARE_PROVIDER_SITE_OTHER): Payer: Medicare Other

## 2022-08-03 DIAGNOSIS — I495 Sick sinus syndrome: Secondary | ICD-10-CM | POA: Diagnosis not present

## 2022-08-03 LAB — CUP PACEART REMOTE DEVICE CHECK
Battery Remaining Longevity: 37 mo
Battery Remaining Percentage: 26 %
Battery Voltage: 2.93 V
Brady Statistic RV Percent Paced: 99 %
Date Time Interrogation Session: 20240715020015
Implantable Lead Connection Status: 753985
Implantable Lead Connection Status: 753985
Implantable Lead Implant Date: 20160705
Implantable Lead Implant Date: 20160705
Implantable Lead Location: 753859
Implantable Lead Location: 753860
Implantable Lead Model: 1948
Implantable Pulse Generator Implant Date: 20160705
Lead Channel Impedance Value: 580 Ohm
Lead Channel Pacing Threshold Amplitude: 0.5 V
Lead Channel Pacing Threshold Pulse Width: 0.4 ms
Lead Channel Sensing Intrinsic Amplitude: 12 mV
Lead Channel Setting Pacing Amplitude: 0.75 V
Lead Channel Setting Pacing Pulse Width: 0.4 ms
Lead Channel Setting Sensing Sensitivity: 2 mV
Pulse Gen Model: 2240
Pulse Gen Serial Number: 7779900

## 2022-08-11 DIAGNOSIS — N1832 Chronic kidney disease, stage 3b: Secondary | ICD-10-CM | POA: Diagnosis not present

## 2022-08-11 DIAGNOSIS — N271 Small kidney, bilateral: Secondary | ICD-10-CM | POA: Diagnosis not present

## 2022-08-14 NOTE — Progress Notes (Signed)
Remote pacemaker transmission.   

## 2022-08-25 DIAGNOSIS — M1712 Unilateral primary osteoarthritis, left knee: Secondary | ICD-10-CM | POA: Diagnosis not present

## 2022-08-25 DIAGNOSIS — M25562 Pain in left knee: Secondary | ICD-10-CM | POA: Diagnosis not present

## 2022-09-03 DIAGNOSIS — Z961 Presence of intraocular lens: Secondary | ICD-10-CM | POA: Diagnosis not present

## 2022-09-03 DIAGNOSIS — H52223 Regular astigmatism, bilateral: Secondary | ICD-10-CM | POA: Diagnosis not present

## 2022-10-07 DIAGNOSIS — R7989 Other specified abnormal findings of blood chemistry: Secondary | ICD-10-CM | POA: Diagnosis not present

## 2022-10-07 DIAGNOSIS — N183 Chronic kidney disease, stage 3 unspecified: Secondary | ICD-10-CM | POA: Diagnosis not present

## 2022-10-07 DIAGNOSIS — I4891 Unspecified atrial fibrillation: Secondary | ICD-10-CM | POA: Diagnosis not present

## 2022-10-07 DIAGNOSIS — N1832 Chronic kidney disease, stage 3b: Secondary | ICD-10-CM | POA: Diagnosis not present

## 2022-10-19 DIAGNOSIS — N183 Chronic kidney disease, stage 3 unspecified: Secondary | ICD-10-CM | POA: Diagnosis not present

## 2022-10-19 DIAGNOSIS — R7989 Other specified abnormal findings of blood chemistry: Secondary | ICD-10-CM | POA: Diagnosis not present

## 2022-10-19 DIAGNOSIS — I4891 Unspecified atrial fibrillation: Secondary | ICD-10-CM | POA: Diagnosis not present

## 2022-10-19 DIAGNOSIS — N400 Enlarged prostate without lower urinary tract symptoms: Secondary | ICD-10-CM | POA: Diagnosis not present

## 2022-11-02 ENCOUNTER — Ambulatory Visit (INDEPENDENT_AMBULATORY_CARE_PROVIDER_SITE_OTHER): Payer: Medicare Other

## 2022-11-02 DIAGNOSIS — I495 Sick sinus syndrome: Secondary | ICD-10-CM

## 2022-11-04 LAB — CUP PACEART REMOTE DEVICE CHECK
Battery Remaining Longevity: 35 mo
Battery Remaining Percentage: 24 %
Battery Voltage: 2.93 V
Brady Statistic RV Percent Paced: 99 %
Date Time Interrogation Session: 20241014020012
Implantable Lead Connection Status: 753985
Implantable Lead Connection Status: 753985
Implantable Lead Implant Date: 20160705
Implantable Lead Implant Date: 20160705
Implantable Lead Location: 753859
Implantable Lead Location: 753860
Implantable Lead Model: 1948
Implantable Pulse Generator Implant Date: 20160705
Lead Channel Impedance Value: 560 Ohm
Lead Channel Pacing Threshold Amplitude: 0.5 V
Lead Channel Pacing Threshold Pulse Width: 0.4 ms
Lead Channel Sensing Intrinsic Amplitude: 11.7 mV
Lead Channel Setting Pacing Amplitude: 0.75 V
Lead Channel Setting Pacing Pulse Width: 0.4 ms
Lead Channel Setting Sensing Sensitivity: 2 mV
Pulse Gen Model: 2240
Pulse Gen Serial Number: 7779900

## 2022-11-16 NOTE — Progress Notes (Signed)
Remote pacemaker transmission.   

## 2022-11-27 DIAGNOSIS — I495 Sick sinus syndrome: Secondary | ICD-10-CM | POA: Diagnosis not present

## 2022-11-27 DIAGNOSIS — Z950 Presence of cardiac pacemaker: Secondary | ICD-10-CM | POA: Diagnosis not present

## 2022-11-27 DIAGNOSIS — I482 Chronic atrial fibrillation, unspecified: Secondary | ICD-10-CM | POA: Diagnosis not present

## 2022-11-27 DIAGNOSIS — Z1330 Encounter for screening examination for mental health and behavioral disorders, unspecified: Secondary | ICD-10-CM | POA: Diagnosis not present

## 2022-12-15 ENCOUNTER — Telehealth: Payer: Self-pay | Admitting: Cardiology

## 2022-12-15 NOTE — Telephone Encounter (Signed)
I released the patient in [facility]. I canceled all the upcoming home remote appointments. I also marked the patient Inactive in [facility].

## 2022-12-15 NOTE — Telephone Encounter (Signed)
Daughter Rulon Eisenmenger) called to provide where to send patient information:  Dr. Dorisann Frames, [facility], phone# [phone], [address], [place], [place] [address].  Daughter wants call back to confirm if patient needs a device visit.

## 2022-12-15 NOTE — Telephone Encounter (Signed)
Called to schedule overdue f/u with EP, patients wife states that they have moved to [facility] and will need all of their information sent to the cardiologist there. She says that she will have her daughter call us back to give [place] the information on where to send the records to. FYI.

## 2022-12-23 DIAGNOSIS — Z1331 Encounter for screening for depression: Secondary | ICD-10-CM | POA: Diagnosis not present

## 2022-12-23 DIAGNOSIS — G300 Alzheimer's disease with early onset: Secondary | ICD-10-CM | POA: Diagnosis not present

## 2022-12-23 DIAGNOSIS — I4891 Unspecified atrial fibrillation: Secondary | ICD-10-CM | POA: Diagnosis not present

## 2022-12-23 DIAGNOSIS — R32 Unspecified urinary incontinence: Secondary | ICD-10-CM | POA: Diagnosis not present

## 2022-12-25 DIAGNOSIS — Z45010 Encounter for checking and testing of cardiac pacemaker pulse generator [battery]: Secondary | ICD-10-CM | POA: Diagnosis not present

## 2022-12-29 DIAGNOSIS — R319 Hematuria, unspecified: Secondary | ICD-10-CM | POA: Diagnosis not present

## 2022-12-29 DIAGNOSIS — N1831 Chronic kidney disease, stage 3a: Secondary | ICD-10-CM | POA: Diagnosis not present

## 2022-12-29 DIAGNOSIS — I4821 Permanent atrial fibrillation: Secondary | ICD-10-CM | POA: Diagnosis not present

## 2022-12-29 DIAGNOSIS — E039 Hypothyroidism, unspecified: Secondary | ICD-10-CM | POA: Diagnosis not present

## 2023-01-01 DIAGNOSIS — I4891 Unspecified atrial fibrillation: Secondary | ICD-10-CM | POA: Diagnosis not present

## 2023-01-01 DIAGNOSIS — D696 Thrombocytopenia, unspecified: Secondary | ICD-10-CM | POA: Diagnosis not present

## 2023-01-01 DIAGNOSIS — E039 Hypothyroidism, unspecified: Secondary | ICD-10-CM | POA: Diagnosis not present

## 2023-01-01 DIAGNOSIS — N1832 Chronic kidney disease, stage 3b: Secondary | ICD-10-CM | POA: Diagnosis not present

## 2023-01-01 DIAGNOSIS — D649 Anemia, unspecified: Secondary | ICD-10-CM | POA: Diagnosis not present

## 2023-01-04 DIAGNOSIS — I083 Combined rheumatic disorders of mitral, aortic and tricuspid valves: Secondary | ICD-10-CM | POA: Diagnosis not present

## 2023-01-04 DIAGNOSIS — I4891 Unspecified atrial fibrillation: Secondary | ICD-10-CM | POA: Diagnosis not present

## 2023-01-04 DIAGNOSIS — Z95818 Presence of other cardiac implants and grafts: Secondary | ICD-10-CM | POA: Diagnosis not present

## 2023-01-13 ENCOUNTER — Other Ambulatory Visit: Payer: Self-pay | Admitting: Cardiology

## 2023-01-13 DIAGNOSIS — I4811 Longstanding persistent atrial fibrillation: Secondary | ICD-10-CM

## 2023-01-14 NOTE — Telephone Encounter (Signed)
 [email or web address] to schedule overdue f/u

## 2023-01-14 NOTE — Telephone Encounter (Signed)
Prescription refill request for Xarelto received.  Indication: a flutter Last office visit: 09/05/21 Weight: 171# Age: 88 Scr: 1.58 care everywhere CrCl: 35 ml/min  Needs appt

## 2023-01-22 ENCOUNTER — Other Ambulatory Visit: Payer: Self-pay

## 2023-01-22 ENCOUNTER — Telehealth: Payer: Self-pay | Admitting: *Deleted

## 2023-01-22 DIAGNOSIS — I4811 Longstanding persistent atrial fibrillation: Secondary | ICD-10-CM

## 2023-01-22 MED ORDER — RIVAROXABAN 15 MG PO TABS: 15.0000 mg | ORAL_TABLET | Freq: Every day | ORAL | 3 refills | Status: AC

## 2023-01-22 NOTE — Telephone Encounter (Signed)
 Prescription refill request for Xarelto received.  Indication:afib Last office visit:11/24 Weight:77.6  kg Age:89 Scr:1.47  12/24 CrCl:37.39  ml/min  Prescription refilled

## 2023-01-22 NOTE — Telephone Encounter (Addendum)
 Xarelto  15mg  refill request received. Pt is 89 years old, weight-77.6kg, Crea-1.58 on 10/07/22 via Care Everywhere from [facility], last seen by Dr. Jeffrie on 09/05/21-needs appt, Diagnosis-Afib, CrCl-34.79 mL/min; Dose is appropriate based on dosing criteria.    Per Care Everywhere pt had a New Patient Appt with [facility] and was seen on 11/27/22.  Will call the pharmacy to have them send to correct Cardiologist.  Xarelto  15mg  every day refill sent today at 1011am. Called [facility] and advised the pt has a new Cardiologist and the prescription needs to be canceled as pt has not been seen by our Cardiologist since 09/05/21 and has a new Cardiologist Dr. Gretel in [place], [place].  She states she will discontinue the eliquis refill for Dr Jeffrie and send to Dr Lamar Gretel and she states he is listed on her profile.  Refill not sent this encounter.
